# Patient Record
Sex: Female | Born: 1955 | Race: Black or African American | Hispanic: No | Marital: Single | Smoking: Former smoker
Health system: Southern US, Community
[De-identification: ages and names within clinical notes are randomized; demographics above are authoritative.]

## PROBLEM LIST (undated history)

## (undated) DIAGNOSIS — I10 Essential (primary) hypertension: Secondary | ICD-10-CM

## (undated) DIAGNOSIS — E119 Type 2 diabetes mellitus without complications: Secondary | ICD-10-CM

## (undated) HISTORY — PX: ABDOMINAL HYSTERECTOMY: SHX81

## (undated) HISTORY — PX: FOOT SURGERY: SHX648

---

## 1998-03-24 ENCOUNTER — Encounter: Admission: RE | Admit: 1998-03-24 | Discharge: 1998-03-24 | Payer: Self-pay | Admitting: Internal Medicine

## 1998-07-21 ENCOUNTER — Encounter: Admission: RE | Admit: 1998-07-21 | Discharge: 1998-07-21 | Payer: Self-pay | Admitting: Internal Medicine

## 1998-09-05 ENCOUNTER — Encounter: Admission: RE | Admit: 1998-09-05 | Discharge: 1998-09-05 | Payer: Self-pay | Admitting: Internal Medicine

## 1998-12-13 ENCOUNTER — Emergency Department (HOSPITAL_COMMUNITY): Admission: EM | Admit: 1998-12-13 | Discharge: 1998-12-13 | Payer: Self-pay | Admitting: Emergency Medicine

## 1998-12-14 ENCOUNTER — Encounter: Admission: RE | Admit: 1998-12-14 | Discharge: 1998-12-14 | Payer: Self-pay | Admitting: Hematology and Oncology

## 1999-01-15 ENCOUNTER — Encounter: Admission: RE | Admit: 1999-01-15 | Discharge: 1999-01-15 | Payer: Self-pay | Admitting: Internal Medicine

## 2000-03-28 ENCOUNTER — Ambulatory Visit (HOSPITAL_COMMUNITY): Admission: RE | Admit: 2000-03-28 | Discharge: 2000-03-28 | Payer: Self-pay | Admitting: Hematology and Oncology

## 2000-03-28 ENCOUNTER — Encounter: Payer: Self-pay | Admitting: Hematology and Oncology

## 2000-03-28 ENCOUNTER — Encounter: Admission: RE | Admit: 2000-03-28 | Discharge: 2000-03-28 | Payer: Self-pay | Admitting: Internal Medicine

## 2004-12-13 ENCOUNTER — Ambulatory Visit (HOSPITAL_COMMUNITY): Admission: RE | Admit: 2004-12-13 | Discharge: 2004-12-13 | Payer: Self-pay | Admitting: *Deleted

## 2008-08-05 ENCOUNTER — Ambulatory Visit (HOSPITAL_COMMUNITY): Admission: RE | Admit: 2008-08-05 | Discharge: 2008-08-05 | Payer: Self-pay | Admitting: Cardiology

## 2008-09-01 DIAGNOSIS — E119 Type 2 diabetes mellitus without complications: Secondary | ICD-10-CM | POA: Insufficient documentation

## 2008-09-02 ENCOUNTER — Ambulatory Visit: Payer: Self-pay | Admitting: Internal Medicine

## 2008-09-02 DIAGNOSIS — R0989 Other specified symptoms and signs involving the circulatory and respiratory systems: Secondary | ICD-10-CM | POA: Insufficient documentation

## 2008-09-02 DIAGNOSIS — I10 Essential (primary) hypertension: Secondary | ICD-10-CM | POA: Insufficient documentation

## 2008-09-02 DIAGNOSIS — R0609 Other forms of dyspnea: Secondary | ICD-10-CM | POA: Insufficient documentation

## 2008-09-06 ENCOUNTER — Encounter (INDEPENDENT_AMBULATORY_CARE_PROVIDER_SITE_OTHER): Payer: Self-pay | Admitting: *Deleted

## 2008-09-15 ENCOUNTER — Telehealth (INDEPENDENT_AMBULATORY_CARE_PROVIDER_SITE_OTHER): Payer: Self-pay | Admitting: *Deleted

## 2009-11-27 ENCOUNTER — Emergency Department (HOSPITAL_COMMUNITY): Admission: EM | Admit: 2009-11-27 | Discharge: 2009-11-27 | Payer: Self-pay | Admitting: Emergency Medicine

## 2010-12-06 NOTE — Letter (Signed)
Summary: [facility] Results Follow Up Letter  [facility]  [address]   [place], [place] [address]   Phone: [phone]  Fax: [phone]    09/06/2008 MRN: [id number]  FLORELLA MCNEESE [address] [place], [place]  [address]  Dear Ms. Estrin,  We have received the results from your recent tests and have been unable to contact you.  Please call our office at [phone] so that Dr. Sherene Sires or his nurse may review the results with you.    Thank you,  Nature conservation officer Pulmonary Division

## 2011-01-20 LAB — POCT I-STAT, CHEM 8
BUN: 16 mg/dL (ref 6–23)
Calcium, Ion: 1.19 mmol/L (ref 1.12–1.32)
Chloride: 106 mEq/L (ref 96–112)
Creatinine, Ser: 1.2 mg/dL (ref 0.4–1.2)
Glucose, Bld: 145 mg/dL — ABNORMAL HIGH (ref 70–99)
HCT: 41 % (ref 36.0–46.0)
Hemoglobin: 13.9 g/dL (ref 12.0–15.0)
Potassium: 4 mEq/L (ref 3.5–5.1)
Sodium: 140 mEq/L (ref 135–145)
TCO2: 28 mmol/L (ref 0–100)

## 2011-08-06 LAB — HEMOGLOBIN A1C
Hgb A1c MFr Bld: 9.4 — ABNORMAL HIGH
Mean Plasma Glucose: 223

## 2011-08-06 LAB — BASIC METABOLIC PANEL
BUN: 12
CO2: 28
Calcium: 9.6
Chloride: 100
Creatinine, Ser: 1
GFR calc Af Amer: >60
GFR calc non Af Amer: 58 — ABNORMAL LOW
Glucose, Bld: 205 — ABNORMAL HIGH
Potassium: 4
Sodium: 137

## 2011-08-06 LAB — HEPATIC FUNCTION PANEL
ALT: 39 — ABNORMAL HIGH
AST: 39 — ABNORMAL HIGH
Albumin: 4.2
Alkaline Phosphatase: 78
Bilirubin, Direct: 0.2
Indirect Bilirubin: 1.6 — ABNORMAL HIGH
Total Bilirubin: 1.8 — ABNORMAL HIGH
Total Protein: 7.5

## 2011-08-06 LAB — MICROALBUMIN, URINE: Microalb, Ur: 33 — ABNORMAL HIGH

## 2011-08-06 LAB — LIPID PANEL
Cholesterol: 208 — ABNORMAL HIGH
HDL: 26 — ABNORMAL LOW
LDL Cholesterol: 141 — ABNORMAL HIGH
Total CHOL/HDL Ratio: 8
Triglycerides: 207 — ABNORMAL HIGH
VLDL: 41 — ABNORMAL HIGH

## 2012-11-13 ENCOUNTER — Other Ambulatory Visit: Payer: Self-pay | Admitting: Cardiology

## 2012-11-13 DIAGNOSIS — Z1231 Encounter for screening mammogram for malignant neoplasm of breast: Secondary | ICD-10-CM

## 2012-12-08 ENCOUNTER — Ambulatory Visit
Admission: RE | Admit: 2012-12-08 | Discharge: 2012-12-08 | Disposition: A | Discharge status: Home / Self Care | Payer: Self-pay | Source: Ambulatory Visit | Attending: Cardiology | Admitting: Cardiology

## 2012-12-08 DIAGNOSIS — Z1231 Encounter for screening mammogram for malignant neoplasm of breast: Secondary | ICD-10-CM

## 2012-12-09 ENCOUNTER — Other Ambulatory Visit: Payer: Self-pay | Admitting: Cardiology

## 2012-12-09 DIAGNOSIS — R928 Other abnormal and inconclusive findings on diagnostic imaging of breast: Secondary | ICD-10-CM

## 2012-12-23 ENCOUNTER — Ambulatory Visit
Admission: RE | Admit: 2012-12-23 | Discharge: 2012-12-23 | Disposition: A | Discharge status: Home / Self Care | Payer: Medicaid Other | Source: Ambulatory Visit | Attending: Cardiology | Admitting: Cardiology

## 2012-12-23 DIAGNOSIS — R928 Other abnormal and inconclusive findings on diagnostic imaging of breast: Secondary | ICD-10-CM

## 2013-04-28 ENCOUNTER — Encounter: Payer: Self-pay | Admitting: Obstetrics

## 2013-05-31 ENCOUNTER — Ambulatory Visit: Payer: Self-pay | Admitting: Obstetrics & Gynecology

## 2013-09-08 ENCOUNTER — Ambulatory Visit: Payer: Self-pay | Admitting: Obstetrics & Gynecology

## 2013-11-05 DIAGNOSIS — F17200 Nicotine dependence, unspecified, uncomplicated: Secondary | ICD-10-CM | POA: Insufficient documentation

## 2013-11-05 DIAGNOSIS — I10 Essential (primary) hypertension: Secondary | ICD-10-CM | POA: Diagnosis not present

## 2013-11-05 DIAGNOSIS — Z79899 Other long term (current) drug therapy: Secondary | ICD-10-CM | POA: Insufficient documentation

## 2013-11-05 DIAGNOSIS — E860 Dehydration: Secondary | ICD-10-CM | POA: Diagnosis not present

## 2013-11-05 DIAGNOSIS — E663 Overweight: Secondary | ICD-10-CM | POA: Diagnosis not present

## 2013-11-05 DIAGNOSIS — E119 Type 2 diabetes mellitus without complications: Secondary | ICD-10-CM | POA: Diagnosis present

## 2013-11-06 ENCOUNTER — Emergency Department (HOSPITAL_COMMUNITY)
Admission: EM | Admit: 2013-11-06 | Discharge: 2013-11-06 | Disposition: A | Discharge status: Home / Self Care | Payer: PRIVATE HEALTH INSURANCE | Attending: Emergency Medicine | Admitting: Emergency Medicine

## 2013-11-06 ENCOUNTER — Encounter (HOSPITAL_COMMUNITY): Payer: Self-pay | Admitting: Emergency Medicine

## 2013-11-06 DIAGNOSIS — R739 Hyperglycemia, unspecified: Secondary | ICD-10-CM

## 2013-11-06 DIAGNOSIS — E119 Type 2 diabetes mellitus without complications: Secondary | ICD-10-CM | POA: Diagnosis not present

## 2013-11-06 HISTORY — DX: Essential (primary) hypertension: I10

## 2013-11-06 HISTORY — DX: Type 2 diabetes mellitus without complications: E11.9

## 2013-11-06 LAB — COMPREHENSIVE METABOLIC PANEL
ALT: 42 U/L — ABNORMAL HIGH (ref 0–35)
AST: 39 U/L — ABNORMAL HIGH (ref 0–37)
Albumin: 3.9 g/dL (ref 3.5–5.2)
Alkaline Phosphatase: 111 U/L (ref 39–117)
BUN: 23 mg/dL (ref 6–23)
CO2: 25 mEq/L (ref 19–32)
Calcium: 9.4 mg/dL (ref 8.4–10.5)
Chloride: 94 mEq/L — ABNORMAL LOW (ref 96–112)
Creatinine, Ser: 1.35 mg/dL — ABNORMAL HIGH (ref 0.50–1.10)
GFR calc Af Amer: 49 mL/min — ABNORMAL LOW (ref >90)
GFR calc non Af Amer: 43 mL/min — ABNORMAL LOW (ref >90)
Glucose, Bld: 553 mg/dL (ref 70–99)
Potassium: 4.7 mEq/L (ref 3.7–5.3)
Sodium: 133 mEq/L — ABNORMAL LOW (ref 137–147)
Total Bilirubin: 0.8 mg/dL (ref 0.3–1.2)
Total Protein: 7.7 g/dL (ref 6.0–8.3)

## 2013-11-06 LAB — URINE MICROSCOPIC-ADD ON

## 2013-11-06 LAB — URINALYSIS, ROUTINE W REFLEX MICROSCOPIC
Bilirubin Urine: NEGATIVE
Glucose, UA: >1000 mg/dL — AB
Hgb urine dipstick: NEGATIVE
Ketones, ur: NEGATIVE mg/dL
Leukocytes, UA: NEGATIVE
Nitrite: NEGATIVE
Protein, ur: NEGATIVE mg/dL
Specific Gravity, Urine: <1.005 — ABNORMAL LOW (ref 1.005–1.030)
Urobilinogen, UA: 0.2 mg/dL (ref 0.0–1.0)
pH: 5.5 (ref 5.0–8.0)

## 2013-11-06 LAB — CBC WITH DIFFERENTIAL/PLATELET
Basophils Absolute: 0.1 10*3/uL (ref 0.0–0.1)
Basophils Relative: 1 % (ref 0–1)
Eosinophils Absolute: 0.1 10*3/uL (ref 0.0–0.7)
Eosinophils Relative: 2 % (ref 0–5)
HCT: 38.3 % (ref 36.0–46.0)
Hemoglobin: 13 g/dL (ref 12.0–15.0)
Lymphocytes Relative: 50 % — ABNORMAL HIGH (ref 12–46)
Lymphs Abs: 4 10*3/uL (ref 0.7–4.0)
MCH: 32.4 pg (ref 26.0–34.0)
MCHC: 33.9 g/dL (ref 30.0–36.0)
MCV: 95.5 fL (ref 78.0–100.0)
Monocytes Absolute: 0.4 10*3/uL (ref 0.1–1.0)
Monocytes Relative: 5 % (ref 3–12)
Neutro Abs: 3.5 10*3/uL (ref 1.7–7.7)
Neutrophils Relative %: 43 % (ref 43–77)
Platelets: 159 10*3/uL (ref 150–400)
RBC: 4.01 MIL/uL (ref 3.87–5.11)
RDW: 13.2 % (ref 11.5–15.5)
WBC: 8 10*3/uL (ref 4.0–10.5)

## 2013-11-06 LAB — GLUCOSE, CAPILLARY
Glucose-Capillary: 253 mg/dL — ABNORMAL HIGH (ref 70–99)
Glucose-Capillary: 340 mg/dL — ABNORMAL HIGH (ref 70–99)
Glucose-Capillary: 515 mg/dL — ABNORMAL HIGH (ref 70–99)

## 2013-11-06 MED ORDER — INSULIN ASPART 100 UNIT/ML ~~LOC~~ SOLN
10.0000 [IU] | Freq: Once | SUBCUTANEOUS | Status: AC
  Administered 2013-11-06: 10 [IU] via SUBCUTANEOUS
  Filled 2013-11-06: qty 1

## 2013-11-06 MED ORDER — ONDANSETRON HCL 4 MG/2ML IJ SOLN
4.0000 mg | Freq: Once | INTRAMUSCULAR | Status: AC
  Administered 2013-11-06: 4 mg via INTRAVENOUS
  Filled 2013-11-06: qty 2

## 2013-11-06 MED ORDER — SODIUM CHLORIDE 0.9 % IV BOLUS (SEPSIS): 1000.0000 mL | Freq: Once | INTRAVENOUS | Status: DC

## 2013-11-06 MED ORDER — SODIUM CHLORIDE 0.9 % IV BOLUS (SEPSIS)
2000.0000 mL | Freq: Once | INTRAVENOUS | Status: AC
  Administered 2013-11-06: 2000 mL via INTRAVENOUS

## 2013-11-06 NOTE — ED Notes (Signed)
The pt diabetic.  No meds for 3 weeks until 3 days ago .  Now her sugar is high and she has been voiding more and feeling weak with nausea

## 2013-11-06 NOTE — ED Notes (Signed)
Pt. States that she has been out of her DM meds X 3weeks so she knew her BS was going up. She did not get it filled and restarted until a day or 2 ago.

## 2013-11-06 NOTE — Discharge Instructions (Signed)
You need to take your medicines as prescribed.   Follow up with your doctor.   Stay hydrated.   Return to ER if you have vomiting, worse weakness, severe pain.

## 2013-11-06 NOTE — ED Provider Notes (Signed)
CSN: [id number]     Arrival date & time 11/05/13  2355 History   First MD Initiated Contact with Patient 11/06/13 [phone]     Chief Complaint  Patient presents with  . Hyperglycemia   (Consider location/radiation/quality/duration/timing/severity/associated sxs/prior Treatment) The history is provided by the patient.  Amanda Hatfield is a 58 y.o. female hx of HTN, DM, here with hyperglycemia. She was out of her meds for the last 3 weeks and refilled it yesterday. She has been feeling weak and nauseous and urinating more frequently. Denies any fevers or vomiting or abdominal pain. She has her medicine and has been taking since yesterday. She checked her sugar at home and it was 500.    Past Medical History  Diagnosis Date  . Hypertension   . Diabetes mellitus without complication    History reviewed. No pertinent past surgical history. No family history on file. History  Substance Use Topics  . Smoking status: Current Every Day Smoker  . Smokeless tobacco: Not on file  . Alcohol Use: No   OB History   Grav Para Term Preterm Abortions TAB SAB Ect Mult Living                 Review of Systems  Gastrointestinal: Positive for nausea.  Neurological: Positive for weakness.  All other systems reviewed and are negative.    Allergies  Review of patient's allergies indicates no known allergies.  Home Medications   Current Outpatient Rx  Name  Route  Sig  Dispense  Refill  . glimepiride (AMARYL) 4 MG tablet   Oral   Take 1 tablet by mouth daily.         Marland Kitchen losartan (COZAAR) 100 MG tablet   Oral   Take 1 tablet by mouth daily.         . metFORMIN (GLUCOPHAGE) 500 MG tablet   Oral   Take 1 tablet by mouth 2 (two) times daily.         . metoprolol succinate (TOPROL-XL) 50 MG 24 hr tablet   Oral   Take 1 tablet by mouth daily.         . pioglitazone (ACTOS) 15 MG tablet   Oral   Take 1 tablet by mouth daily.         . polyethylene glycol powder (GLYCOLAX/MIRALAX)  powder   Oral   Take 17 g by mouth daily.         . simvastatin (ZOCOR) 40 MG tablet   Oral   Take 1 tablet by mouth daily.         Marland Kitchen tiZANidine (ZANAFLEX) 4 MG tablet   Oral   Take 1 tablet by mouth 2 (two) times daily.          BP 101/62  Pulse 67  Temp(Src) 98 F (36.7 C) (Oral)  Resp 18  Wt 256 lb 6 oz (116.291 kg)  SpO2 96% Physical Exam  Nursing note and vitals reviewed. Constitutional: She is oriented to person, place, and time.  Overweight, tired, slightly dehydrated   HENT:  Head: Normocephalic.  MM slightly dry   Eyes: Conjunctivae are normal. Pupils are equal, round, and reactive to light.  Neck: Normal range of motion. Neck supple.  Cardiovascular: Normal rate, regular rhythm and normal heart sounds.   Pulmonary/Chest: Effort normal and breath sounds normal. No respiratory distress. She has no wheezes. She has no rales.  Abdominal: Soft. Bowel sounds are normal. She exhibits no distension. There is no tenderness. There  is no rebound and no guarding.  Musculoskeletal: Normal range of motion. She exhibits no edema.  Neurological: She is alert and oriented to person, place, and time. No cranial nerve deficit. Coordination normal.  Skin: Skin is warm and dry.  Psychiatric: She has a normal mood and affect. Her behavior is normal. Judgment and thought content normal.    ED Course  Procedures (including critical care time) Labs Review Labs Reviewed  CBC WITH DIFFERENTIAL - Abnormal; Notable for the following:    Lymphocytes Relative 50 (*)    All other components within normal limits  COMPREHENSIVE METABOLIC PANEL - Abnormal; Notable for the following:    Sodium 133 (*)    Chloride 94 (*)    Glucose, Bld 553 (*)    Creatinine, Ser 1.35 (*)    AST 39 (*)    ALT 42 (*)    GFR calc non Af Amer 43 (*)    GFR calc Af Amer 49 (*)    All other components within normal limits  URINALYSIS, ROUTINE W REFLEX MICROSCOPIC - Abnormal; Notable for the following:     Specific Gravity, Urine <1.005 (*)    Glucose, UA >1000 (*)    All other components within normal limits  GLUCOSE, CAPILLARY - Abnormal; Notable for the following:    Glucose-Capillary 515 (*)    All other components within normal limits  GLUCOSE, CAPILLARY - Abnormal; Notable for the following:    Glucose-Capillary 340 (*)    All other components within normal limits  GLUCOSE, CAPILLARY - Abnormal; Notable for the following:    Glucose-Capillary 253 (*)    All other components within normal limits  URINE MICROSCOPIC-ADD ON   Imaging Review No results found.  EKG Interpretation   None       MDM  No diagnosis found. Amanda Hatfield is a 58 y.o. female here with hyperglycemia. Only mild AG of 14. CBG dec from 515 to 253 after IVF and subcutaneous insulin. No signs of DKA and tolerated PO fluids. Stable for d/c.      Richardean Canalavid H Yao, MD 11/06/13 [id number]

## 2014-02-09 ENCOUNTER — Other Ambulatory Visit: Payer: Self-pay | Admitting: Internal Medicine

## 2014-02-09 DIAGNOSIS — N6310 Unspecified lump in the right breast, unspecified quadrant: Secondary | ICD-10-CM

## 2014-02-18 ENCOUNTER — Other Ambulatory Visit: Payer: Self-pay | Admitting: Internal Medicine

## 2014-02-18 ENCOUNTER — Ambulatory Visit
Admission: RE | Admit: 2014-02-18 | Discharge: 2014-02-18 | Disposition: A | Discharge status: Home / Self Care | Payer: PRIVATE HEALTH INSURANCE | Source: Ambulatory Visit | Attending: Internal Medicine | Admitting: Internal Medicine

## 2014-02-18 DIAGNOSIS — N6310 Unspecified lump in the right breast, unspecified quadrant: Secondary | ICD-10-CM

## 2015-01-03 ENCOUNTER — Other Ambulatory Visit (HOSPITAL_COMMUNITY): Payer: Self-pay | Admitting: Internal Medicine

## 2015-01-03 ENCOUNTER — Ambulatory Visit (HOSPITAL_COMMUNITY)
Admission: RE | Admit: 2015-01-03 | Discharge: 2015-01-03 | Disposition: A | Discharge status: Home / Self Care | Payer: Commercial Managed Care - HMO | Source: Ambulatory Visit | Attending: Internal Medicine | Admitting: Internal Medicine

## 2015-01-03 DIAGNOSIS — M47896 Other spondylosis, lumbar region: Secondary | ICD-10-CM | POA: Insufficient documentation

## 2015-01-03 DIAGNOSIS — R52 Pain, unspecified: Secondary | ICD-10-CM

## 2015-01-03 DIAGNOSIS — M25552 Pain in left hip: Secondary | ICD-10-CM | POA: Diagnosis not present

## 2015-04-05 ENCOUNTER — Other Ambulatory Visit: Payer: Self-pay | Admitting: Internal Medicine

## 2015-04-05 ENCOUNTER — Other Ambulatory Visit: Payer: Self-pay

## 2015-04-05 DIAGNOSIS — N6489 Other specified disorders of breast: Secondary | ICD-10-CM

## 2015-04-11 ENCOUNTER — Other Ambulatory Visit: Payer: Commercial Managed Care - HMO

## 2015-04-12 ENCOUNTER — Ambulatory Visit
Admission: RE | Admit: 2015-04-12 | Discharge: 2015-04-12 | Disposition: A | Discharge status: Home / Self Care | Payer: Commercial Managed Care - HMO | Source: Ambulatory Visit | Attending: Internal Medicine | Admitting: Internal Medicine

## 2015-04-12 DIAGNOSIS — N6489 Other specified disorders of breast: Secondary | ICD-10-CM

## 2016-03-01 ENCOUNTER — Other Ambulatory Visit: Payer: Self-pay

## 2016-03-01 DIAGNOSIS — Z1231 Encounter for screening mammogram for malignant neoplasm of breast: Secondary | ICD-10-CM

## 2016-04-15 ENCOUNTER — Ambulatory Visit: Payer: Commercial Managed Care - HMO

## 2016-04-23 ENCOUNTER — Ambulatory Visit
Admission: RE | Admit: 2016-04-23 | Discharge: 2016-04-23 | Disposition: A | Discharge status: Home / Self Care | Payer: Medicare HMO | Source: Ambulatory Visit

## 2016-04-23 ENCOUNTER — Other Ambulatory Visit: Payer: Self-pay | Admitting: Internal Medicine

## 2016-04-23 DIAGNOSIS — Z1231 Encounter for screening mammogram for malignant neoplasm of breast: Secondary | ICD-10-CM

## 2017-03-13 ENCOUNTER — Other Ambulatory Visit: Payer: Self-pay | Admitting: Internal Medicine

## 2017-03-13 DIAGNOSIS — Z1231 Encounter for screening mammogram for malignant neoplasm of breast: Secondary | ICD-10-CM

## 2017-04-24 ENCOUNTER — Ambulatory Visit: Payer: Medicare HMO

## 2017-04-30 ENCOUNTER — Other Ambulatory Visit: Payer: Self-pay | Admitting: Internal Medicine

## 2017-04-30 DIAGNOSIS — E2839 Other primary ovarian failure: Secondary | ICD-10-CM

## 2017-05-06 ENCOUNTER — Ambulatory Visit
Admission: RE | Admit: 2017-05-06 | Discharge: 2017-05-06 | Disposition: A | Discharge status: Home / Self Care | Payer: Medicare Other | Source: Ambulatory Visit | Attending: Internal Medicine | Admitting: Internal Medicine

## 2017-05-06 ENCOUNTER — Ambulatory Visit: Payer: Medicare HMO

## 2017-05-06 DIAGNOSIS — Z1231 Encounter for screening mammogram for malignant neoplasm of breast: Secondary | ICD-10-CM

## 2017-05-06 DIAGNOSIS — E2839 Other primary ovarian failure: Secondary | ICD-10-CM

## 2017-05-09 ENCOUNTER — Ambulatory Visit (INDEPENDENT_AMBULATORY_CARE_PROVIDER_SITE_OTHER): Payer: Medicaid Other | Admitting: Specialist

## 2017-05-22 ENCOUNTER — Ambulatory Visit (INDEPENDENT_AMBULATORY_CARE_PROVIDER_SITE_OTHER): Payer: Medicaid Other | Admitting: Specialist

## 2017-07-24 ENCOUNTER — Encounter (INDEPENDENT_AMBULATORY_CARE_PROVIDER_SITE_OTHER): Payer: Self-pay | Admitting: Specialist

## 2017-07-24 ENCOUNTER — Ambulatory Visit (INDEPENDENT_AMBULATORY_CARE_PROVIDER_SITE_OTHER): Payer: Medicare Other

## 2017-07-24 ENCOUNTER — Ambulatory Visit (INDEPENDENT_AMBULATORY_CARE_PROVIDER_SITE_OTHER): Payer: Medicare Other | Admitting: Specialist

## 2017-07-24 VITALS — BP 116/86 | HR 77 | Ht 62.0 in | Wt 216.0 lb

## 2017-07-24 DIAGNOSIS — M25512 Pain in left shoulder: Secondary | ICD-10-CM

## 2017-07-24 DIAGNOSIS — M545 Low back pain: Secondary | ICD-10-CM | POA: Diagnosis not present

## 2017-07-24 DIAGNOSIS — M7582 Other shoulder lesions, left shoulder: Secondary | ICD-10-CM

## 2017-07-24 DIAGNOSIS — M48062 Spinal stenosis, lumbar region with neurogenic claudication: Secondary | ICD-10-CM

## 2017-07-24 DIAGNOSIS — M7542 Impingement syndrome of left shoulder: Secondary | ICD-10-CM

## 2017-07-24 DIAGNOSIS — M778 Other enthesopathies, not elsewhere classified: Secondary | ICD-10-CM

## 2017-07-24 NOTE — Progress Notes (Signed)
Office Visit Note   Patient: Amanda Hatfield.           Date of Birth: 07-Dec-1955           MRN: [id number] Visit Date: 07/24/2017              Requested by: Fleet Contras, MD [address] [place], [place] [address] PCP: Fleet Contras, MD   Assessment & Plan: Visit Diagnoses:  1. Low back pain, unspecified back pain laterality, unspecified chronicity, with sciatica presence unspecified   2. Left shoulder pain, unspecified chronicity     Plan:Avoid overhead lifting and overhead use of the arms. Do not lift greater than 10 lbs. Tylenol ES one every 6-8 hours for pain and inflamation. Continue with PT for another 2-3 weeks, then a home exercise program. Avoid bending, stooping and avoid lifting weights greater than 10 lbs. Avoid prolong standing and walking. Avoid frequent bending and stooping  No lifting greater than 10 lbs. May use ice or moist heat for pain. Weight loss is of benefit. Handicap license is approved. Dr. [place] Blas secretary/Assistant will call to arrange for epidural steroid injection   Follow-Up Instructions: No Follow-up on file.   Orders:  Orders Placed This Encounter  Procedures  . XR Lumbar Spine 2-3 Views  . XR Shoulder Left   No orders of the defined types were placed in this encounter.     Procedures: Large Joint Inj Date/Time: 07/24/2017 1:14 PM Performed by: Kerrin Champagne Authorized by: Kerrin Champagne   Consent Given by:  Patient Site marked: the procedure site was marked   Indications:  Pain Location:  Shoulder Site:  L subacromial bursa Prep: patient was prepped and draped in usual sterile fashion   Needle Size:  25 G Needle Length:  1.5 inches Approach:  Anterolateral Ultrasound Guidance: No   Fluoroscopic Guidance: No   Arthrogram: No   Medications:  40 mg methylPREDNISolone acetate 40 MG/ML; 4 mL bupivacaine 0.25 % Aspiration Attempted: No   Patient tolerance:  Patient tolerated the procedure well with no immediate  complications     Clinical Data: No additional findings.   Subjective: Chief Complaint  Patient presents with  . Lower Back - Pain  . Left Shoulder - Pain    61 year old female seen in the past with lumbar spinal stenosis she has had previous ESIs with excellent relief the last in 2013 left L4-5 ESI with excellent relief. Pain is present standing sitting and lying, leaning. Leans on cart when grocery shopping. Sitting is not better than standing. Right now the pain is present sitting. Lying with pain and with bending increase. Pain is into the left thigh deep and radiates down into the left calf anterior and the anterior medial and lateral calf. Definitely standing and walking is worse. Can only about one block then has to sit and wait a few minutes and sit. Has to sit for the pain to improve. No bowel or bladder difficulty.Left shoulder pain with lying on the left shoulder and over head lifting and overhead use of the left arm has had increase in use of the arms with recent change of domicile.     Review of Systems  Constitutional: Positive for activity change. Negative for appetite change, diaphoresis, fatigue, fever and unexpected weight change.  HENT: Negative.  Negative for congestion, dental problem, drooling, ear discharge, ear pain, facial swelling, hearing loss, mouth sores, nosebleeds, postnasal drip, rhinorrhea, sinus pain, sinus pressure, sneezing, sore throat, tinnitus, trouble  swallowing and voice change.   Eyes: Negative.   Respiratory: Negative for chest tightness, shortness of breath and wheezing.   Cardiovascular: Negative.  Negative for chest pain, palpitations and leg swelling.  Gastrointestinal: Positive for blood in stool. Negative for abdominal distention, constipation, diarrhea, nausea, rectal pain and vomiting.  Endocrine: Positive for cold intolerance and heat intolerance.  Genitourinary: Negative.  Negative for difficulty urinating, dyspareunia, dysuria,  enuresis, flank pain, frequency and hematuria.  Musculoskeletal: Positive for arthralgias, back pain and gait problem.  Skin: Negative.  Negative for color change, pallor, rash and wound.  Allergic/Immunologic: Negative.  Negative for environmental allergies, food allergies and immunocompromised state.  Neurological: Negative for dizziness, tremors, seizures, syncope, facial asymmetry, speech difficulty, weakness, light-headedness, numbness and headaches.  Hematological: Negative.  Negative for adenopathy. Does not bruise/bleed easily.  Psychiatric/Behavioral: Negative.  Negative for behavioral problems, confusion, decreased concentration, dysphoric mood, hallucinations, self-injury, sleep disturbance and suicidal ideas. The patient is not nervous/anxious and is not hyperactive.      Objective: Vital Signs: BP 116/86 (BP Location: Left Arm, Patient Position: Sitting)   Pulse 77   Ht  (1.575 m)   Wt 216 lb (98 kg)   BMI 39.51 kg/m   Physical Exam  Constitutional: She is oriented to person, place, and time. She appears well-developed and well-nourished.  HENT:  Head: Normocephalic and atraumatic.  Eyes: Pupils are equal, round, and reactive to light. EOM are normal.  Neck: Normal range of motion. Neck supple.  Pulmonary/Chest: Effort normal and breath sounds normal.  Abdominal: Soft. Bowel sounds are normal.  Neurological: She is alert and oriented to person, place, and time.  Skin: Skin is warm and dry. She is not diaphoretic.  Psychiatric: She has a normal mood and affect. Her behavior is normal. Judgment and thought content normal.    Back Exam   Tenderness  The patient is experiencing tenderness in the lumbar.  Range of Motion  Extension:  10 abnormal  Flexion:  70 normal  Lateral Bend Right: abnormal  Lateral Bend Left: abnormal   Muscle Strength  Right Quadriceps:  5/5  Left Quadriceps:  5/5  Right Hamstrings:  5/5  Left Hamstrings:  5/5   Tests  Straight leg  raise right: negative Straight leg raise left: negative  Reflexes  Patellar: Hyporeflexic Achilles: Hyporeflexic Babinski's sign: normal   Other  Toe Walk: abnormal Heel Walk: abnormal Sensation: normal Gait: normal  Erythema: no back redness      Specialty Comments:  No specialty comments available.  Imaging: No results found.   PMFS History: Patient Active Problem List   Diagnosis Date Noted  . HYPERTENSION 09/02/2008  . DYSPNEA 09/02/2008  . DIABETES, TYPE 2 09/01/2008   Past Medical History:  Diagnosis Date  . Diabetes mellitus without complication (HCC)   . Hypertension     No family history on file.  No past surgical history on file. Social History   Occupational History  . housekeeping   . Surveyor, mining    Social History Main Topics  . Smoking status: Former Smoker    Types: Cigarettes, E-cigarettes  . Smokeless tobacco: Current User  . Alcohol use No  . Drug use: Unknown  . Sexual activity: Not on file

## 2017-07-24 NOTE — Patient Instructions (Signed)
Avoid overhead lifting and overhead use of the arms. Do not lift greater than 10 lbs. Tylenol ES one every 6-8 hours for pain and inflamation. Continue with PT for another 2-3 weeks, then a home exercise program. Avoid bending, stooping and avoid lifting weights greater than 10 lbs. Avoid prolong standing and walking. Avoid frequent bending and stooping  No lifting greater than 10 lbs. May use ice or moist heat for pain. Weight loss is of benefit. Handicap license is approved. Dr. [place] Blas secretary/Assistant will call to arrange for epidural steroid injection

## 2017-08-14 ENCOUNTER — Ambulatory Visit (INDEPENDENT_AMBULATORY_CARE_PROVIDER_SITE_OTHER): Payer: Medicare Other

## 2017-08-14 ENCOUNTER — Encounter (INDEPENDENT_AMBULATORY_CARE_PROVIDER_SITE_OTHER): Payer: Self-pay | Admitting: Physical Medicine and Rehabilitation

## 2017-08-14 ENCOUNTER — Ambulatory Visit (INDEPENDENT_AMBULATORY_CARE_PROVIDER_SITE_OTHER): Payer: Medicare Other | Admitting: Physical Medicine and Rehabilitation

## 2017-08-14 VITALS — BP 141/90 | HR 76 | Temp 99.1°F

## 2017-08-14 DIAGNOSIS — M5416 Radiculopathy, lumbar region: Secondary | ICD-10-CM

## 2017-08-14 MED ORDER — LIDOCAINE HCL (PF) 1 % IJ SOLN
2.0000 mL | Freq: Once | INTRAMUSCULAR | Status: AC
  Administered 2017-08-14: 2 mL

## 2017-08-14 MED ORDER — BETAMETHASONE SOD PHOS & ACET 6 (3-3) MG/ML IJ SUSP
12.0000 mg | Freq: Once | INTRAMUSCULAR | Status: AC
  Administered 2017-08-14: 12 mg

## 2017-08-14 NOTE — Patient Instructions (Signed)

## 2017-08-14 NOTE — Progress Notes (Deleted)
Pt here for lumber injection of lower back, pt has driver, pt taking baby aspirin, no allergy to dye, pain mostly right side radiating to the left, no numbness or tingling but is throbbing some.back has been constantly hurting rather she is sitting, standing etc, pt taking tramadol for pain.

## 2017-08-21 ENCOUNTER — Ambulatory Visit (INDEPENDENT_AMBULATORY_CARE_PROVIDER_SITE_OTHER): Payer: Medicare Other | Admitting: Specialist

## 2017-08-21 NOTE — Procedures (Signed)
Amanda Hatfield is a 61 year old female with chronic long history of back pain and radicular pain. She's had prior injections of lumbar spine 2013. She is followed by Dr. Theda Sersnegative saw her recently. He has requested diagnostic L3 and L4 transforaminal injections on the left. She is having left radicular pain. She also has some right-sided low back pain.The injection  will be diagnostic and hopefully therapeutic. The patient has failed conservative care including time, medications and activity modification.  Lumbosacral Transforaminal Epidural Steroid Injection - Sub-Pedicular Approach with Fluoroscopic Guidance  Patient: Amanda Hatfield V.      Date of Birth: 09/21/1956 MRN: [id number] PCP: Fleet ContrasAvbuere, Edwin, MD      Visit Date: 08/14/2017   Universal Protocol:    Date/Time: 08/14/2017  Consent Given By: the patient  Position: PRONE  Additional Comments: Vital signs were monitored before and after the procedure. Patient was prepped and draped in the usual sterile fashion. The correct patient, procedure, and site was verified.   Injection Procedure Details:  Procedure Site One Meds Administered:  Meds ordered this encounter  Medications  . lidocaine (PF) (XYLOCAINE) 1 % injection 2 mL  . betamethasone acetate-betamethasone sodium phosphate (CELESTONE) injection 12 mg    Laterality: Left  Location/Site:  L3-L4 L4-L5  Needle size: 22 G  Needle type: Spinal  Needle Placement: Transforaminal  Findings:  -Contrast Used: 0.5 mL iohexol 180 mg iodine/mL   -Comments: Excellent flow of contrast along the nerve and into the epidural space.  Procedure Details: After squaring off the end-plates to get a true AP view, the C-arm was positioned so that an oblique view of the foramen as noted above was visualized. The target area is just inferior to the "nose of the scotty dog" or sub pedicular. The soft tissues overlying this structure were infiltrated with 2-3 ml. of 1% Lidocaine without  Epinephrine.  The spinal needle was inserted toward the target using a "trajectory" view along the fluoroscope beam.  Under AP and lateral visualization, the needle was advanced so it did not puncture dura and was located close the 6 O'Clock position of the pedical in AP tracterory. Biplanar projections were used to confirm position. Aspiration was confirmed to be negative for CSF and/or blood. A 1-2 ml. volume of Isovue-250 was injected and flow of contrast was noted at each level. Radiographs were obtained for documentation purposes.   After attaining the desired flow of contrast documented above, a 0.5 to 1.0 ml test dose of 0.25% Marcaine was injected into each respective transforaminal space.  The patient was observed for 90 seconds post injection.  After no sensory deficits were reported, and normal lower extremity motor function was noted,   the above injectate was administered so that equal amounts of the injectate were placed at each foramen (level) into the transforaminal epidural space.   Additional Comments:  The patient tolerated the procedure well Dressing: Band-Aid    Post-procedure details: Patient was observed during the procedure. Post-procedure instructions were reviewed.  Patient left the clinic in stable condition.

## 2017-09-08 MED ORDER — METHYLPREDNISOLONE ACETATE 40 MG/ML IJ SUSP
40.0000 mg | INTRAMUSCULAR | Status: AC | PRN
  Administered 2017-07-24: 40 mg via INTRA_ARTICULAR

## 2017-09-08 MED ORDER — BUPIVACAINE HCL 0.25 % IJ SOLN
4.0000 mL | INTRAMUSCULAR | Status: AC | PRN
  Administered 2017-07-24: 4 mL via INTRA_ARTICULAR

## 2018-04-27 ENCOUNTER — Other Ambulatory Visit: Payer: Self-pay | Admitting: Internal Medicine

## 2018-04-27 DIAGNOSIS — Z1231 Encounter for screening mammogram for malignant neoplasm of breast: Secondary | ICD-10-CM

## 2018-05-20 ENCOUNTER — Ambulatory Visit
Admission: RE | Admit: 2018-05-20 | Discharge: 2018-05-20 | Disposition: A | Discharge status: Home / Self Care | Payer: Medicare Other | Source: Ambulatory Visit | Attending: Internal Medicine | Admitting: Internal Medicine

## 2018-05-20 DIAGNOSIS — Z1231 Encounter for screening mammogram for malignant neoplasm of breast: Secondary | ICD-10-CM

## 2018-05-28 ENCOUNTER — Ambulatory Visit (INDEPENDENT_AMBULATORY_CARE_PROVIDER_SITE_OTHER): Payer: Self-pay

## 2018-05-28 ENCOUNTER — Encounter (INDEPENDENT_AMBULATORY_CARE_PROVIDER_SITE_OTHER): Payer: Self-pay | Admitting: Surgery

## 2018-05-28 ENCOUNTER — Ambulatory Visit (INDEPENDENT_AMBULATORY_CARE_PROVIDER_SITE_OTHER): Payer: Medicare Other | Admitting: Surgery

## 2018-05-28 VITALS — BP 116/71 | HR 89

## 2018-05-28 DIAGNOSIS — M25551 Pain in right hip: Secondary | ICD-10-CM | POA: Diagnosis not present

## 2018-05-28 DIAGNOSIS — M5416 Radiculopathy, lumbar region: Secondary | ICD-10-CM | POA: Diagnosis not present

## 2018-05-28 MED ORDER — KETOROLAC TROMETHAMINE 60 MG/2ML IM SOLN: 60.0000 mg | Freq: Once | INTRAMUSCULAR | Status: AC

## 2018-05-28 NOTE — Progress Notes (Signed)
Office Visit Note   Patient: Amanda RudeJuline Shimmin V.           Date of Birth: 08/10/1956           MRN: [id number] Visit Date: 05/28/2018              Requested by: Fleet ContrasAvbuere, Edwin, MD [address] [place], [place] [id number] PCP: Fleet ContrasAvbuere, Edwin, MD   Assessment & Plan: Visit Diagnoses:  1. Pain in right hip   2. Radiculopathy, lumbar region     Plan: Since patient continues have ongoing symptoms and failed conservative treatment with lumbar ESI's I will schedule MRI to rule out HNP/stenosis.  Follow-up with Dr. Otelia SergeantNitka after completion to discuss results and further treatment options.  In hopes of giving her some improvement of her pain she was given a Toradol 60 mg IM injection today.  Did not feel comfortable doing Depo-Medrol or giving a prednisone taper due to history of diabetes.  Follow-Up Instructions: Return in about 3 weeks (around 06/18/2018) for With Dr. Otelia SergeantNitka to review lumbar MRI.   Orders:  Orders Placed This Encounter  Procedures  . XR Lumbar Spine Complete W/Bend  . MR Lumbar Spine w/o contrast   Meds ordered this encounter  Medications  . ketorolac (TORADOL) injection 60 mg      Procedures: No procedures performed   Clinical Data: No additional findings.   Subjective: Chief Complaint  Patient presents with  . Right Hip - Pain    HPI 62 year old white female returns with complaints of chronic back pain and right lower extremity radiculopathy.  Patient has history of multilevel lumbar spondylosis and had lumbar ESI by Dr. Alvester MorinNewton October 2018.  States that this may improve some of her symptoms for few days.  She complains of pain radiating to the right buttock and down to her right foot with numbness and tingling.  No radicular pain in the left leg.  States that her symptoms are progressively worsening. Review of Systems No current cardiac pulmonary GI GU issues  Objective: Vital Signs: BP 116/71 (BP Location: Left Arm, Patient Position: Sitting, Cuff  Size: Normal)   Pulse 89   Physical Exam  Constitutional: She is oriented to person, place, and time. No distress.  HENT:  Head: Normocephalic and atraumatic.  Eyes: Pupils are equal, round, and reactive to light. EOM are normal.  Pulmonary/Chest: No respiratory distress.  Musculoskeletal:  Gait is antalgic.  She has bilateral lumbar paraspinal tenderness/spasm.  Positive bilateral sciatic notch tenderness.  Tender over the left hip greater trochanter bursa.  Negative logroll.  Negative straight leg raise.  No focal motor deficits.  Calves are nontender.  Neurological: She is alert and oriented to person, place, and time.  Skin: Skin is warm and dry.  Psychiatric: She has a normal mood and affect.    Ortho Exam  Specialty Comments:  No specialty comments available.  Imaging: No results found.   PMFS History: Patient Active Problem List   Diagnosis Date Noted  . HYPERTENSION 09/02/2008  . DYSPNEA 09/02/2008  . DIABETES, TYPE 2 09/01/2008   Past Medical History:  Diagnosis Date  . Diabetes mellitus without complication (HCC)   . Hypertension     History reviewed. No pertinent family history.  History reviewed. No pertinent surgical history. Social History   Occupational History  . Occupation: housekeeping  . Occupation: Surveyor, miningpoultry worker  Tobacco Use  . Smoking status: Former Smoker    Types: Cigarettes, E-cigarettes  . Smokeless tobacco: Current User  Substance and Sexual Activity  . Alcohol use: No  . Drug use: Not on file  . Sexual activity: Not on file

## 2019-05-06 ENCOUNTER — Other Ambulatory Visit: Payer: Self-pay | Admitting: Internal Medicine

## 2019-05-06 DIAGNOSIS — Z1231 Encounter for screening mammogram for malignant neoplasm of breast: Secondary | ICD-10-CM

## 2019-06-18 ENCOUNTER — Other Ambulatory Visit: Payer: Self-pay

## 2019-06-18 ENCOUNTER — Ambulatory Visit
Admission: RE | Admit: 2019-06-18 | Discharge: 2019-06-18 | Disposition: A | Discharge status: Home / Self Care | Payer: Medicare Other | Source: Ambulatory Visit | Attending: Internal Medicine | Admitting: Internal Medicine

## 2019-06-18 DIAGNOSIS — Z1231 Encounter for screening mammogram for malignant neoplasm of breast: Secondary | ICD-10-CM

## 2020-01-27 ENCOUNTER — Other Ambulatory Visit: Payer: Self-pay

## 2020-01-27 ENCOUNTER — Ambulatory Visit: Payer: Self-pay

## 2020-01-27 ENCOUNTER — Encounter: Payer: Self-pay | Admitting: Surgery

## 2020-01-27 ENCOUNTER — Ambulatory Visit (INDEPENDENT_AMBULATORY_CARE_PROVIDER_SITE_OTHER): Payer: Medicare HMO | Admitting: Surgery

## 2020-01-27 DIAGNOSIS — M5416 Radiculopathy, lumbar region: Secondary | ICD-10-CM | POA: Diagnosis not present

## 2020-01-27 DIAGNOSIS — M545 Low back pain, unspecified: Secondary | ICD-10-CM

## 2020-01-27 DIAGNOSIS — G8929 Other chronic pain: Secondary | ICD-10-CM | POA: Diagnosis not present

## 2020-01-27 NOTE — Progress Notes (Signed)
   Office Visit Note   Patient: Amanda Hatfield.           Date of Birth: 08-01-1956           MRN: [id number] Visit Date: 01/27/2020              Requested by: Fleet Contras, MD [address] [place],  [place] [address] PCP: Fleet Contras, MD   Assessment & Plan: Visit Diagnoses:  1. Chronic low back pain, unspecified back pain laterality, unspecified whether sciatica present   2. Radiculopathy, lumbar region     Plan: With patient's progressively worsening symptoms since my last office visit with her July 2019 I will  reorder her lumbar MRI. Will follow with Dr. Otelia Sergeant after completion to discuss results and further treatment options.  Follow-Up Instructions: Return in about 3 weeks (around 02/17/2020) for WITH DR NITKA TO REVIEW LUMBAR MRI SCAN.   Orders:  Orders Placed This Encounter  Procedures  . XR Lumbar Spine 2-3 Views  . MR Lumbar Spine w/o contrast   No orders of the defined types were placed in this encounter.     Procedures: No procedures performed   Clinical Data: No additional findings.   Subjective: Chief Complaint  Patient presents with  . Lower Back - Pain    HPI 64 year old low back pain and right lower extremity radiculopathy.  Patient has known history of lumbar spondylosis.  She was seen by me May 28, 2018 for similar complaint and I had ordered a lumbar MRI scan but this was not done for some reason.  States that symptoms have been progressively worsening.  Aggravated with standing, laying down and prolonged ambulation.  No left leg symptoms.  She has received medications from her primary care physician without relief of her pain. Review of Systems No current cardiac pulmonary GI GU issues  Objective: Vital Signs: There were no vitals taken for this visit.  Physical Exam Eyes:     Extraocular Movements: Extraocular movements intact.     Pupils: Pupils are equal, round, and reactive to light.  Pulmonary:     Effort: No respiratory  distress.  Musculoskeletal:     Comments: Gait antalgic.  Negative log bilat hips.  Neg straight leg raise.  No focal motor deficits.   Neurological:     General: No focal deficit present.     Mental Status: She is alert and oriented to person, place, and time.     Ortho Exam  Specialty Comments:  No specialty comments available.  Imaging: No results found.   PMFS History: Patient Active Problem List   Diagnosis Date Noted  . HYPERTENSION 09/02/2008  . DYSPNEA 09/02/2008  . DIABETES, TYPE 2 09/01/2008   Past Medical History:  Diagnosis Date  . Diabetes mellitus without complication (HCC)   . Hypertension     History reviewed. No pertinent family history.  History reviewed. No pertinent surgical history. Social History   Occupational History  . Occupation: housekeeping  . Occupation: Surveyor, mining  Tobacco Use  . Smoking status: Former Smoker    Types: Cigarettes, E-cigarettes  . Smokeless tobacco: Current User  Substance and Sexual Activity  . Alcohol use: No  . Drug use: Not on file  . Sexual activity: Not on file

## 2020-02-14 ENCOUNTER — Other Ambulatory Visit: Payer: Self-pay

## 2020-02-14 ENCOUNTER — Ambulatory Visit (HOSPITAL_COMMUNITY)
Admission: RE | Admit: 2020-02-14 | Discharge: 2020-02-14 | Disposition: A | Discharge status: Home / Self Care | Payer: Medicare HMO | Source: Ambulatory Visit | Attending: Surgery | Admitting: Surgery

## 2020-02-14 DIAGNOSIS — M5416 Radiculopathy, lumbar region: Secondary | ICD-10-CM | POA: Diagnosis present

## 2020-02-17 ENCOUNTER — Other Ambulatory Visit: Payer: Self-pay

## 2020-02-17 ENCOUNTER — Ambulatory Visit (INDEPENDENT_AMBULATORY_CARE_PROVIDER_SITE_OTHER): Payer: Medicare HMO | Admitting: Specialist

## 2020-02-17 ENCOUNTER — Encounter: Payer: Self-pay | Admitting: Specialist

## 2020-02-17 VITALS — BP 139/79 | HR 96 | Ht 62.0 in | Wt 215.0 lb

## 2020-02-17 DIAGNOSIS — M48062 Spinal stenosis, lumbar region with neurogenic claudication: Secondary | ICD-10-CM

## 2020-02-17 DIAGNOSIS — M4316 Spondylolisthesis, lumbar region: Secondary | ICD-10-CM

## 2020-02-17 NOTE — Patient Instructions (Signed)
Avoid bending, stooping and avoid lifting weights greater than 10 lbs. Avoid prolong standing and walking. Avoid frequent bending and stooping  No lifting greater than 10 lbs. May use ice or moist heat for pain. Weight loss is of benefit. Handicap license is approved. Dr. Gilbert Blas secretary/Assistant will call to arrange for epidural steroid injection I don't recommend arthritis medications due to previous history of elevated creatinine and renal insufficiency and diabetes.   Hot showers in the AM.  Injection with steroid may be of benefit. Hemp CBD capsules, [email or web address] 5,000-7,000 mg per bottle, 60 capsules per bottle, take one capsule twice a day.  Follow-Up Instructions: No follow-ups on file.

## 2020-02-17 NOTE — Progress Notes (Signed)
Office Visit Note   Patient: Amanda Hatfield.           Date of Birth: Apr 08, 1956           MRN: [id number] Visit Date: 02/17/2020              Requested by: Nolene Ebbs, MD [address] [place],  [place] [address] PCP: Nolene Ebbs, MD   Assessment & Plan: Visit Diagnoses:  1. Spinal stenosis of lumbar region with neurogenic claudication   2. Spondylolisthesis, lumbar region     Plan: Avoid bending, stooping and avoid lifting weights greater than 10 lbs. Avoid prolong standing and walking. Avoid frequent bending and stooping  No lifting greater than 10 lbs. May use ice or moist heat for pain. Weight loss is of benefit. Handicap license is approved. Dr. Romona Curls secretary/Assistant will call to arrange for epidural steroid injection I don't recommend arthritis medications due to previous history of elevated creatinine and renal insufficiency and diabetes.   Hot showers in the AM.  Injection with steroid may be of benefit. Hemp CBD capsules, [email or web address] 5,000-7,000 mg per bottle, 60 capsules per bottle, take one capsule twice a day.  Follow-Up Instructions: No follow-ups on file.   Orders:  No orders of the defined types were placed in this encounter.  No orders of the defined types were placed in this encounter.     Procedures: No procedures performed   Clinical Data: Findings:  CLINICAL DATA: Radiculopathy, lumbar region. Worsening low back pain, right lower extremity radiculopathy, neurogenic claudication; low back pain, greater than 6 weeks; lumbar radiculopathy, greater than 6 weeks, low back pain, progressive neurologic deficit. Additional history provided by technologist: Patient reports low back pain for years, numbness and tingling down both legs into feet.  EXAM: MRI LUMBAR SPINE WITHOUT CONTRAST  TECHNIQUE: Multiplanar, multisequence MR imaging of the lumbar spine was performed. No intravenous contrast was administered.  COMPARISON:  Radiographs of the lumbar spine 01/27/2020, lumbar spine MRI 03/14/2011  FINDINGS: Mild intermittent motion degradation.  Segmentation: For the purposes of this dictation, five lumbar vertebrae are assumed and the caudal most well-formed intervertebral disc is designated L5-S1.  Alignment: Straightening of the expected lumbar lordosis. L2-L3 grade 1 anterolisthesis. Trace L3-L4 and L4-L5 anterolisthesis.  Vertebrae: Vertebral body height is maintained. Edema signal within the bilateral L4 pedicles (greater on the right) and right L5 pedicle, which may be degenerative and related to adjacent facet arthrosis. Stress reaction may also have this imaging appearance. Mild multilevel predominantly fatty degenerative endplate marrow signal.  Conus medullaris and cauda equina: Conus extends to the L1-L2 level. 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  Paraspinal and other soft tissues: Incompletely assessed right renal cysts. Atrophy of the lumbar paraspinal musculature.  Disc levels:  Unless otherwise stated, the level by level findings below have not significantly changed since prior MRI 03/15/2011.  Mild to moderate L2-L3 disc degeneration, progressed. Mild disc degeneration at the remaining levels is similar to the prior exam.  T11-T12: This level is not included on axial imaging. Small disc bulge with redemonstrated small left foraminal disc protrusion. No more than mild spinal canal stenosis. Incompletely assessed left neural foraminal narrowing.  T12-L1: Small disc bulge. A small superimposed central disc protrusion is new from prior exam. Mild relative narrowing of the spinal canal without nerve root impingement. No significant foraminal stenosis.  L1-L2: Mild facet arthrosis. No significant disc herniation, spinal canal  stenosis or neural foraminal narrowing.  L2-L3: Grade 1 anterolisthesis  is new from prior examination. Disc bulge, progressed. Advanced facet arthrosis with ligamentum flavum hypertrophy, progressed. 3 mm ventrally projecting synovial facet cyst on the left new from prior exam (series 8, image 17). Moderately severe spinal canal stenosis with right greater than left subarticular narrowing, progressed. Mild bilateral neural foraminal narrowing, progressed.  L3-L4: Grade 1 anterolisthesis. Disc bulge, progressed. Superimposed broad-based right foraminal/extraforaminal disc protrusion. Advanced facet arthrosis with ligamentum flavum hypertrophy, progressed. Severe spinal canal stenosis with near complete effacement of the thecal sac, progressed. Unchanged moderate right neural foraminal narrowing.  L4-L5: Disc bulge with endplate spurring. Superimposed right subarticular/foraminal disc protrusion. Moderate facet arthrosis with ligamentum flavum hypertrophy. Small amount of asymmetric fluid within the right facet joint. Moderate right with mild left subarticular stenosis, not significantly changed. Progressive moderate central canal stenosis. Unchanged moderate right neural foraminal narrowing.  L5-S1: Disc bulge. Central zone posterior annular fissure. Moderate facet arthrosis/ligamentum flavum hypertrophy. Minimal bilateral subarticular narrowing without nerve root impingement. Central canal patent. Mild bilateral neural foraminal narrowing (greater on the right).  IMPRESSION: Lumbar spondylosis as outlined and progressed at several levels since prior MRI 03/15/2011. Findings are most notably as follows.  At L3-L4, there is progressive multifactorial severe spinal canal stenosis now with near complete effacement of the thecal sac. As before, a disc protrusion contributes to moderate right neural foraminal narrowing.  At L2-L3, progressive multifactorial moderately severe spinal canal stenosis with right greater than left subarticular narrowing.  There is also progressive mild bilateral neural foraminal narrowing at this level.  At L4-L5, progressive multifactorial moderate central canal stenosis. As before, a broad-based right subarticular/foraminal disc protrusion contributes to moderate right subarticular and neural foraminal narrowing.   Electronically Signed  By: Jackey Loge DO  On: 02/14/2020 21:17    Subjective: Chief Complaint  Patient presents with  . Lower Back - Follow-up    MRI Review    64 year old female with neurogenic claudication with ability to grocery shop but it is difficult and she takes awhile to complete. She  Normally sends her granddaughter or daughter to the store. She has pain in the legs bilaterally, difficulty with night pain and constantly changing position in bed at night. No bowel or bladder difficulty, but has a little at night has some difficulty with urgency and has to hold legs together to get herself to the rest room.    Review of Systems  Constitutional: Negative.   HENT: Negative.   Eyes: Negative.   Respiratory: Negative.   Cardiovascular: Negative.   Gastrointestinal: Negative.   Endocrine: Negative.   Genitourinary: Negative.   Musculoskeletal: Positive for back pain and gait problem.  Skin: Negative.   Allergic/Immunologic: Negative.   Hematological: Negative.   Psychiatric/Behavioral: Negative.      Objective: Vital Signs: BP 139/79 (BP Location: Left Arm, Patient Position: Sitting)   Pulse 96   Ht 5\' 2"  (1.575 m)   Wt 215 lb (97.5 kg)   BMI 39.32 kg/m   Physical Exam Constitutional:      Appearance: She is well-developed.  HENT:     Head: Normocephalic and atraumatic.  Eyes:     Pupils: Pupils are equal, round, and reactive to light.  Pulmonary:     Effort: Pulmonary effort is normal.     Breath sounds: Normal breath sounds.  Abdominal:     General: Bowel sounds are normal.     Palpations: Abdomen is soft.  Musculoskeletal:  Cervical back: Normal  range of motion and neck supple.     Lumbar back: Negative right straight leg raise test and negative left straight leg raise test.  Skin:    General: Skin is warm and dry.  Neurological:     Mental Status: She is alert and oriented to person, place, and time.  Psychiatric:        Behavior: Behavior normal.        Thought Content: Thought content normal.        Judgment: Judgment normal.     Back Exam   Tenderness  The patient is experiencing tenderness in the lumbar.  Range of Motion  Extension: abnormal  Flexion: abnormal  Lateral bend right: abnormal  Lateral bend left: abnormal  Rotation right: abnormal  Rotation left: abnormal   Muscle Strength  Right Quadriceps:  5/5  Left Quadriceps:  5/5  Right Hamstrings:  5/5  Left Hamstrings:  5/5   Tests  Straight leg raise right: negative Straight leg raise left: negative  Reflexes  Patellar: 2/4 Achilles: 0/4  Other  Toe walk: abnormal Heel walk: abnormal Sensation: normal Gait: normal   Comments:  Leg strength in sitting is normal. Bilateral genu varus but no grating and good ROm      Specialty Comments:  No specialty comments available.  Imaging: No results found.   PMFS History: Patient Active Problem List   Diagnosis Date Noted  . HYPERTENSION 09/02/2008  . DYSPNEA 09/02/2008  . DIABETES, TYPE 2 09/01/2008   Past Medical History:  Diagnosis Date  . Diabetes mellitus without complication (HCC)   . Hypertension     History reviewed. No pertinent family history.  History reviewed. No pertinent surgical history. Social History   Occupational History  . Occupation: housekeeping  . Occupation: Surveyor, mining  Tobacco Use  . Smoking status: Former Smoker    Types: Cigarettes, E-cigarettes  . Smokeless tobacco: Current User  Substance and Sexual Activity  . Alcohol use: No  . Drug use: Not on file  . Sexual activity: Not on file

## 2020-03-30 ENCOUNTER — Ambulatory Visit (INDEPENDENT_AMBULATORY_CARE_PROVIDER_SITE_OTHER): Payer: Medicare HMO | Admitting: Physical Medicine and Rehabilitation

## 2020-03-30 ENCOUNTER — Ambulatory Visit: Payer: Self-pay

## 2020-03-30 ENCOUNTER — Other Ambulatory Visit: Payer: Self-pay

## 2020-03-30 VITALS — BP 126/79 | HR 88

## 2020-03-30 DIAGNOSIS — M5416 Radiculopathy, lumbar region: Secondary | ICD-10-CM | POA: Diagnosis not present

## 2020-03-30 DIAGNOSIS — M4316 Spondylolisthesis, lumbar region: Secondary | ICD-10-CM

## 2020-03-30 DIAGNOSIS — M48062 Spinal stenosis, lumbar region with neurogenic claudication: Secondary | ICD-10-CM

## 2020-03-30 MED ORDER — DEXAMETHASONE SODIUM PHOSPHATE 10 MG/ML IJ SOLN
15.0000 mg | Freq: Once | INTRAMUSCULAR | Status: AC
  Administered 2020-03-30: 15 mg

## 2020-03-30 NOTE — Progress Notes (Signed)
Pt states pain is in the lower back down both legs. Pt states nothing makes pain better. Pt states everything makes pain worse.  Numeric Pain Rating Scale and Functional Assessment Average Pain (9)   In the last MONTH (on 0-10 scale) has pain interfered with the following?  1. General activity like being  able to carry out your everyday physical activities such as walking, climbing stairs, carrying groceries, or moving a chair?  Rating(9)   +Driver, -BT, -Dye Allergies.

## 2020-03-31 NOTE — Procedures (Signed)
Lumbosacral Transforaminal Epidural Steroid Injection - Sub-Pedicular Approach with Fluoroscopic Guidance  Patient: Amanda Barban V.      Date of Birth: 11/25/1955 MRN: [id number] PCP: Fleet Contras, MD      Visit Date: 03/30/2020   Universal Protocol:    Date/Time: 03/30/2020  Consent Given By: the patient  Position: PRONE  Additional Comments: Vital signs were monitored before and after the procedure. Patient was prepped and draped in the usual sterile fashion. The correct patient, procedure, and site was verified.   Injection Procedure Details:  Procedure Site One Meds Administered:  Meds ordered this encounter  Medications  . dexamethasone (DECADRON) injection 15 mg    Laterality: Bilateral  Location/Site:  L3-L4  Needle size: 22 G  Needle type: Spinal  Needle Placement: Transforaminal  Findings:    -Comments: Excellent flow of contrast along the nerve and into the epidural space.  Procedure Details: After squaring off the end-plates to get a true AP view, the C-arm was positioned so that an oblique view of the foramen as noted above was visualized. The target area is just inferior to the "nose of the scotty dog" or sub pedicular. The soft tissues overlying this structure were infiltrated with 2-3 ml. of 1% Lidocaine without Epinephrine.  The spinal needle was inserted toward the target using a "trajectory" view along the fluoroscope beam.  Under AP and lateral visualization, the needle was advanced so it did not puncture dura and was located close the 6 O'Clock position of the pedical in AP tracterory. Biplanar projections were used to confirm position. Aspiration was confirmed to be negative for CSF and/or blood. A 1-2 ml. volume of Isovue-250 was injected and flow of contrast was noted at each level. Radiographs were obtained for documentation purposes.   After attaining the desired flow of contrast documented above, a 0.5 to 1.0 ml test dose of 0.25% Marcaine  was injected into each respective transforaminal space.  The patient was observed for 90 seconds post injection.  After no sensory deficits were reported, and normal lower extremity motor function was noted,   the above injectate was administered so that equal amounts of the injectate were placed at each foramen (level) into the transforaminal epidural space.   Additional Comments:  The patient tolerated the procedure well Dressing: 2 x 2 sterile gauze and Band-Aid    Post-procedure details: Patient was observed during the procedure. Post-procedure instructions were reviewed.  Patient left the clinic in stable condition.

## 2020-03-31 NOTE — Progress Notes (Signed)
[name] - 64 y.o. female MRN [id number]  Date of birth: 29-May-1956  Office Visit Note: Visit Date: 03/30/2020 PCP: Nolene Ebbs, MD Referred by: Nolene Ebbs, MD  Subjective: Chief Complaint  Patient presents with  . Lower Back - Pain   HPI:  Amanda Hatfield is a 65 y.o. female who comes in today At the request of Dr. Basil Dess for planned Bilateral L3-L4 lumbar transforaminal epidural steroid injection with fluoroscopic guidance.  The patient has failed conservative care including home exercise, medications, time and activity modification.  This injection will be diagnostic and hopefully therapeutic.  Please see requesting physician notes for further details and justification.   Patient's case complicated by significant anxiety, obesity and diabetes.  Patient really needs prior Valium or other sedation for the procedure.  She did okay completing the procedure today.  ROS Otherwise per HPI.  Assessment & Plan: Visit Diagnoses:  1. Lumbar radiculopathy   2. Spinal stenosis of lumbar region with neurogenic claudication   3. Spondylolisthesis of lumbar region     Plan: No additional findings.   Meds & Orders:  Meds ordered this encounter  Medications  . dexamethasone (DECADRON) injection 15 mg    Orders Placed This Encounter  Procedures  . XR C-ARM NO REPORT  . Epidural Steroid injection    Follow-up: Return for visit to requesting physician as needed.   Procedures: No procedures performed  Lumbosacral Transforaminal Epidural Steroid Injection - Sub-Pedicular Approach with Fluoroscopic Guidance  Patient: Amanda Edison V.      Date of Birth: 09/29/1956 MRN: [id number] PCP: Nolene Ebbs, MD      Visit Date: 03/30/2020   Universal Protocol:    Date/Time: 03/30/2020  Consent Given By: the patient  Position: PRONE  Additional Comments: Vital signs were monitored before and after the procedure. Patient was prepped and draped in the usual sterile  fashion. The correct patient, procedure, and site was verified.   Injection Procedure Details:  Procedure Site One Meds Administered:  Meds ordered this encounter  Medications  . dexamethasone (DECADRON) injection 15 mg    Laterality: Bilateral  Location/Site:  L3-L4  Needle size: 22 G  Needle type: Spinal  Needle Placement: Transforaminal  Findings:    -Comments: Excellent flow of contrast along the nerve and into the epidural space.  Procedure Details: After squaring off the end-plates to get a true AP view, the C-arm was positioned so that an oblique view of the foramen as noted above was visualized. The target area is just inferior to the "nose of the scotty dog" or sub pedicular. The soft tissues overlying this structure were infiltrated with 2-3 ml. of 1% Lidocaine without Epinephrine.  The spinal needle was inserted toward the target using a "trajectory" view along the fluoroscope beam.  Under AP and lateral visualization, the needle was advanced so it did not puncture dura and was located close the 6 O'Clock position of the pedical in AP tracterory. Biplanar projections were used to confirm position. Aspiration was confirmed to be negative for CSF and/or blood. A 1-2 ml. volume of Isovue-250 was injected and flow of contrast was noted at each level. Radiographs were obtained for documentation purposes.   After attaining the desired flow of contrast documented above, a 0.5 to 1.0 ml test dose of 0.25% Marcaine was injected into each respective transforaminal space.  The patient was observed for 90 seconds post injection.  After no sensory deficits were reported, and normal lower extremity motor function was  noted,   the above injectate was administered so that equal amounts of the injectate were placed at each foramen (level) into the transforaminal epidural space.   Additional Comments:  The patient tolerated the procedure well Dressing: 2 x 2 sterile gauze and Band-Aid     Post-procedure details: Patient was observed during the procedure. Post-procedure instructions were reviewed.  Patient left the clinic in stable condition.      Clinical History: CLINICAL DATA:  Additional history provided by technologist: Patient reports low back pain for years, numbness and tingling down both legs into feet.  EXAM: MRI LUMBAR SPINE WITHOUT CONTRAST  TECHNIQUE: Multiplanar, multisequence MR imaging of the lumbar spine was performed. No intravenous contrast was administered.  COMPARISON:  Radiographs of the lumbar spine 01/27/2020, lumbar spine MRI 03/14/2011  FINDINGS: Mild intermittent motion degradation.  Segmentation: For the purposes of this dictation, five lumbar vertebrae are assumed and the caudal most well-formed intervertebral disc is designated L5-S1.  Alignment: Straightening of the expected lumbar lordosis. L2-L3 grade 1 anterolisthesis. Trace L3-L4 and L4-L5 anterolisthesis.  Vertebrae: Vertebral body height is maintained. Edema signal within the bilateral L4 pedicles (greater on the right) and right L5 pedicle, which may be degenerative and related to adjacent facet arthrosis. Stress reaction may also have this imaging appearance. Mild multilevel predominantly fatty degenerative endplate marrow signal.  Conus medullaris and cauda equina: Conus extends to the L1-L2 level. 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  Paraspinal and other soft tissues: Incompletely assessed right renal cysts. Atrophy of the lumbar paraspinal musculature.   IMPRESSION: Lumbar spondylosis as outlined and progressed at several levels since prior MRI 03/15/2011. Findings are most notably as follows.  At L3-L4, there is progressive multifactorial severe spinal canal stenosis now with near complete effacement of the thecal sac. As before, a disc protrusion  contributes to moderate right neural foraminal narrowing.  At L2-L3, progressive multifactorial moderately severe spinal canal stenosis with right greater than left subarticular narrowing. There is also progressive mild bilateral neural foraminal narrowing at this level.  At L4-L5, progressive multifactorial moderate central canal stenosis. As before, a broad-based right subarticular/foraminal disc protrusion contributes to moderate right subarticular and neural foraminal narrowing.   Electronically Signed   By: Jackey Loge DO   On: 02/14/2020 21:17     Objective:  VS:  HT:    WT:   BMI:     BP:126/79  HR:88bpm  TEMP: ( )  RESP:  Physical Exam Constitutional:      General: She is not in acute distress.    Appearance: Normal appearance. She is obese. She is not ill-appearing.  HENT:     Head: Normocephalic and atraumatic.     Right Ear: External ear normal.     Left Ear: External ear normal.  Eyes:     Extraocular Movements: Extraocular movements intact.  Cardiovascular:     Rate and Rhythm: Normal rate.     Pulses: Normal pulses.  Musculoskeletal:     Right lower leg: No edema.     Left lower leg: No edema.     Comments: Patient has good distal strength with no pain over the greater trochanters.  No clonus or focal weakness.  Skin:    Findings: No erythema, lesion or rash.  Neurological:     General: No focal deficit present.     Mental Status: She is alert and oriented to person, place, and time.  Sensory: No sensory deficit.     Motor: No weakness or abnormal muscle tone.     Coordination: Coordination normal.  Psychiatric:        Mood and Affect: Mood normal.        Behavior: Behavior normal.      Imaging: XR C-ARM NO REPORT  Result Date: 03/30/2020 Please see Notes tab for imaging impression.

## 2020-04-05 ENCOUNTER — Ambulatory Visit (INDEPENDENT_AMBULATORY_CARE_PROVIDER_SITE_OTHER): Payer: Medicare HMO | Admitting: Specialist

## 2020-04-05 ENCOUNTER — Encounter: Payer: Self-pay | Admitting: Specialist

## 2020-04-05 ENCOUNTER — Other Ambulatory Visit: Payer: Self-pay

## 2020-04-05 VITALS — BP 112/74 | HR 91 | Ht 62.0 in | Wt 215.0 lb

## 2020-04-05 DIAGNOSIS — M4316 Spondylolisthesis, lumbar region: Secondary | ICD-10-CM | POA: Diagnosis not present

## 2020-04-05 DIAGNOSIS — M25551 Pain in right hip: Secondary | ICD-10-CM

## 2020-04-05 DIAGNOSIS — M5416 Radiculopathy, lumbar region: Secondary | ICD-10-CM | POA: Diagnosis not present

## 2020-04-05 DIAGNOSIS — M48062 Spinal stenosis, lumbar region with neurogenic claudication: Secondary | ICD-10-CM | POA: Diagnosis not present

## 2020-04-05 NOTE — Patient Instructions (Signed)
Avoid bending, stooping and avoid lifting weights greater than 10 lbs. Avoid prolong standing and walking. Avoid frequent bending and stooping  No lifting greater than 10 lbs. May use ice or moist heat for pain. Weight loss is of benefit. Handicap license is approved. If the pain returns call us and speak with [facility] my assistant an we would then have Dr. Bradford Blas secretary/Assistant will call to arrange for epidural steroid injection repeated bilaterally at L3-4. You can have as many as 3 sets of injections every 6 months if they successfully relief your pain and restore function. In the event of weakness injections may not help.

## 2020-04-05 NOTE — Progress Notes (Signed)
Office Visit Note   Patient: Amanda Hatfield.           Date of Birth: 04/07/1956           MRN: [id number] Visit Date: 04/05/2020              Requested by: Nolene Ebbs, MD [address] [place],  [place] [address] PCP: Nolene Ebbs, MD   Assessment & Plan: Visit Diagnoses:  1. Spinal stenosis of lumbar region with neurogenic claudication   2. Spondylolisthesis, lumbar region   3. Pain in right hip   4. Radiculopathy, lumbar region     Plan: Avoid bending, stooping and avoid lifting weights greater than 10 lbs. Avoid prolong standing and walking. Avoid frequent bending and stooping  No lifting greater than 10 lbs. May use ice or moist heat for pain. Weight loss is of benefit. Handicap license is approved. If the pain returns call us and speak with [facility] my assistant an we would then have Dr. Romona Curls secretary/Assistant will call to arrange for epidural steroid injection repeated bilaterally at L3-4. You can have as many as 3 sets of injections every 6 months if they successfully relief your pain and restore function. In the event of weakness injections may not help.   Follow-Up Instructions: No follow-ups on file.   Orders:  No orders of the defined types were placed in this encounter.  No orders of the defined types were placed in this encounter.     Procedures: No procedures performed   Clinical Data: No additional findings.   Subjective: Chief Complaint  Patient presents with  . Lower Back - Follow-up    63 year old female with history of lumbar spinal stenosis with spondylolisthesis at L3-4. She has had ESI by Dr. Ernestina Patches bilateral L3-4 transforamenal ESIs for spinal stenosis. She reports relief of pain in back and legs is as much as 85 %. Since the injection she has noted a small amount of right medial knee discomfort and some amount of left low back pain but otherwise she is not hurting.   Review of Systems  Constitutional: Negative.   HENT:  Negative.   Eyes: Negative.   Respiratory: Negative.   Cardiovascular: Negative.   Gastrointestinal: Negative.   Endocrine: Negative.   Genitourinary: Negative.   Musculoskeletal: Negative.   Skin: Negative.   Allergic/Immunologic: Negative.   Neurological: Negative.   Hematological: Negative.   Psychiatric/Behavioral: Negative.      Objective: Vital Signs: BP 112/74 (BP Location: Left Arm, Patient Position: Sitting)   Pulse 91   Ht 5\' 2"  (1.575 m)   Wt 215 lb (97.5 kg)   BMI 39.32 kg/m   Physical Exam Constitutional:      Appearance: She is well-developed.  HENT:     Head: Normocephalic and atraumatic.  Eyes:     Pupils: Pupils are equal, round, and reactive to light.  Pulmonary:     Effort: Pulmonary effort is normal.     Breath sounds: Normal breath sounds.  Abdominal:     General: Bowel sounds are normal.     Palpations: Abdomen is soft.  Musculoskeletal:     Cervical back: Normal range of motion and neck supple.     Lumbar back: Negative right straight leg raise test and negative left straight leg raise test.  Skin:    General: Skin is warm and dry.  Neurological:     Mental Status: She is alert and oriented to person, place, and time.  Psychiatric:  Behavior: Behavior normal.        Thought Content: Thought content normal.        Judgment: Judgment normal.     Back Exam   Tenderness  The patient is experiencing tenderness in the lumbar.  Range of Motion  Extension: abnormal  Flexion: abnormal  Lateral bend right: abnormal  Lateral bend left: abnormal  Rotation right: abnormal  Rotation left: abnormal   Muscle Strength  Right Quadriceps:  5/5  Left Quadriceps:  5/5  Right Hamstrings:  5/5  Left Hamstrings:  5/5   Tests  Straight leg raise right: negative Straight leg raise left: negative  Reflexes  Patellar: abnormal Achilles: abnormal Babinski's sign: normal   Other  Toe walk: normal Heel walk: normal Sensation:  normal Gait: normal       Specialty Comments:  No specialty comments available.  Imaging: No results found.   PMFS History: Patient Active Problem List   Diagnosis Date Noted  . HYPERTENSION 09/02/2008  . DYSPNEA 09/02/2008  . DIABETES, TYPE 2 09/01/2008   Past Medical History:  Diagnosis Date  . Diabetes mellitus without complication (HCC)   . Hypertension     No family history on file.  No past surgical history on file. Social History   Occupational History  . Occupation: housekeeping  . Occupation: Surveyor, mining  Tobacco Use  . Smoking status: Former Smoker    Types: Cigarettes, E-cigarettes  . Smokeless tobacco: Current User  Substance and Sexual Activity  . Alcohol use: No  . Drug use: Not on file  . Sexual activity: Not on file

## 2020-05-31 ENCOUNTER — Ambulatory Visit: Payer: Medicare HMO | Admitting: Specialist

## 2020-05-31 ENCOUNTER — Telehealth: Payer: Self-pay | Admitting: Specialist

## 2020-05-31 NOTE — Telephone Encounter (Signed)
I have put her on the cancellation list and we will call if there is a sooner appt

## 2020-05-31 NOTE — Telephone Encounter (Signed)
Spoke with patient she advised she was running late for her appointment and I  had to reschedule her appointment today.Patient asked to be put on the wait list for earlier appointment. The number to contact patient is [phone]

## 2020-06-12 ENCOUNTER — Ambulatory Visit: Payer: Medicare HMO | Admitting: Specialist

## 2020-06-26 ENCOUNTER — Ambulatory Visit: Payer: Medicare HMO | Admitting: Specialist

## 2020-07-24 ENCOUNTER — Ambulatory Visit (INDEPENDENT_AMBULATORY_CARE_PROVIDER_SITE_OTHER): Payer: Medicare HMO | Admitting: Specialist

## 2020-07-24 ENCOUNTER — Other Ambulatory Visit: Payer: Self-pay

## 2020-07-24 ENCOUNTER — Encounter: Payer: Self-pay | Admitting: Specialist

## 2020-07-24 VITALS — BP 148/82 | HR 89 | Ht 62.0 in | Wt 217.0 lb

## 2020-07-24 DIAGNOSIS — M48062 Spinal stenosis, lumbar region with neurogenic claudication: Secondary | ICD-10-CM

## 2020-07-24 DIAGNOSIS — M4316 Spondylolisthesis, lumbar region: Secondary | ICD-10-CM

## 2020-07-24 DIAGNOSIS — M5416 Radiculopathy, lumbar region: Secondary | ICD-10-CM | POA: Diagnosis not present

## 2020-07-24 NOTE — Progress Notes (Addendum)
Office Visit Note   Patient: Amanda Hatfield.           Date of Birth: 01-Feb-1956           MRN: [id number] Visit Date: 07/24/2020              Requested by: Fleet Contras, MD [address] [place],  [place] [address] PCP: Fleet Contras, MD   Assessment & Plan: Visit Diagnoses:  1. Spinal stenosis of lumbar region with neurogenic claudication   2. Spondylolisthesis, lumbar region   3. Radiculopathy, lumbar region     Plan:Avoid bending, stooping and avoid lifting weights greater than 10 lbs. Avoid prolong standing and walking. Order for a new walker with wheels. Surgery scheduling secretary Tivis Ringer, will call you in the next week to schedule for surgery.  Surgery recommended is a decompression L2-3, L3-4 and L4-5 with three level lumbar fusion L2-3,  L3-4 and L4-5 this would be done with rods, screws and cages with local bone graft and allograft (donor bone graft). Take hydrocodone for for pain. Risk of surgery includes risk of infection 1 in 200 patients, bleeding 1/2% chance you would need a transfusion.   Risk to the nerves is one in 10,000. You will need to use a brace for 3 months and wean from the brace on the 4th month. Expect improved walking and standing tolerance. Expect relief of leg pain but numbness may persist depending on the length and degree of pressure that has been present.    Follow-Up Instructions: No follow-ups on file.   Orders:  No orders of the defined types were placed in this encounter.  No orders of the defined types were placed in this encounter.     Procedures: No procedures performed   Clinical Data: Findings:  Narrative & Impression CLINICAL DATA:  Radiculopathy, lumbar region. Worsening low back pain, right lower extremity radiculopathy, neurogenic claudication; low back pain, greater than 6 weeks; lumbar radiculopathy, greater than 6 weeks, low back pain, progressive neurologic deficit. Additional history provided by  technologist: Patient reports low back pain for years, numbness and tingling down both legs into feet.  EXAM: MRI LUMBAR SPINE WITHOUT CONTRAST  TECHNIQUE: Multiplanar, multisequence MR imaging of the lumbar spine was performed. No intravenous contrast was administered.  COMPARISON:  Radiographs of the lumbar spine 01/27/2020, lumbar spine MRI 03/14/2011  FINDINGS: Mild intermittent motion degradation.  Segmentation: For the purposes of this dictation, five lumbar vertebrae are assumed and the caudal most well-formed intervertebral disc is designated L5-S1.  Alignment: Straightening of the expected lumbar lordosis. L2-L3 grade 1 anterolisthesis. Trace L3-L4 and L4-L5 anterolisthesis.  Vertebrae: Vertebral body height is maintained. Edema signal within the bilateral L4 pedicles (greater on the right) and right L5 pedicle, which may be degenerative and related to adjacent facet arthrosis. Stress reaction may also have this imaging appearance. Mild multilevel predominantly fatty degenerative endplate marrow signal.  Conus medullaris and cauda equina: Conus extends to the L1-L2 level. 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  Paraspinal and other soft tissues: Incompletely assessed right renal cysts. Atrophy of the lumbar paraspinal musculature.  Disc levels:  Unless otherwise stated, the level by level findings below have not significantly changed since prior MRI 03/15/2011.  Mild to moderate L2-L3 disc degeneration, progressed. Mild disc degeneration at the remaining levels is similar to the prior exam.  T11-T12: This level is not included on axial imaging. Small disc  bulge with redemonstrated small left foraminal disc protrusion. No more than mild spinal canal stenosis. Incompletely assessed left neural foraminal narrowing.  T12-L1: Small disc bulge. A small superimposed  central disc protrusion is new from prior exam. Mild relative narrowing of the spinal canal without nerve root impingement. No significant foraminal stenosis.  L1-L2: Mild facet arthrosis. No significant disc herniation, spinal canal stenosis or neural foraminal narrowing.  L2-L3: Grade 1 anterolisthesis is new from prior examination. Disc bulge, progressed. Advanced facet arthrosis with ligamentum flavum hypertrophy, progressed. 3 mm ventrally projecting synovial facet cyst on the left new from prior exam (series 8, image 17). Moderately severe spinal canal stenosis with right greater than left subarticular narrowing, progressed. Mild bilateral neural foraminal narrowing, progressed.  L3-L4: Grade 1 anterolisthesis. Disc bulge, progressed. Superimposed broad-based right foraminal/extraforaminal disc protrusion. Advanced facet arthrosis with ligamentum flavum hypertrophy, progressed. Severe spinal canal stenosis with near complete effacement of the thecal sac, progressed. Unchanged moderate right neural foraminal narrowing.  L4-L5: Disc bulge with endplate spurring. Superimposed right subarticular/foraminal disc protrusion. Moderate facet arthrosis with ligamentum flavum hypertrophy. Small amount of asymmetric fluid within the right facet joint. Moderate right with mild left subarticular stenosis, not significantly changed. Progressive moderate central canal stenosis. Unchanged moderate right neural foraminal narrowing.  L5-S1: Disc bulge. Central zone posterior annular fissure. Moderate facet arthrosis/ligamentum flavum hypertrophy. Minimal bilateral subarticular narrowing without nerve root impingement. Central canal patent. Mild bilateral neural foraminal narrowing (greater on the right).  IMPRESSION: Lumbar spondylosis as outlined and progressed at several levels since prior MRI 03/15/2011. Findings are most notably as follows.  At L3-L4, there is progressive  multifactorial severe spinal canal stenosis now with near complete effacement of the thecal sac. As before, a disc protrusion contributes to moderate right neural foraminal narrowing.  At L2-L3, progressive multifactorial moderately severe spinal canal stenosis with right greater than left subarticular narrowing. There is also progressive mild bilateral neural foraminal narrowing at this level.  At L4-L5, progressive multifactorial moderate central canal stenosis. As before, a broad-based right subarticular/foraminal disc protrusion contributes to moderate right subarticular and neural foraminal narrowing.   Electronically Signed   By: Jackey Loge DO   On: 02/14/2020 21:17      Subjective: Chief Complaint  Patient presents with  . Lower Back - Follow-up    64 year old female with bilateral foot numbness and pain with radiation into the big toes. There is a little bladder difficulty, bowel is constipated. She is not walking long distances but is able to grocery shop will leaning on a grocery cart. Pain in the back and legs is such that she is not able to leave the home but for short periods and is having to sit to relieve pain into the buttocks and thighs after less than 5 minutes of standing. The numbness into the toes is becoming more constant. There is pain also with prolong sitting.     Review of Systems  Constitutional: Negative.   HENT: Negative.   Eyes: Negative.   Respiratory: Negative.   Cardiovascular: Negative.   Gastrointestinal: Negative.   Endocrine: Negative.   Genitourinary: Negative.   Musculoskeletal: Negative.   Skin: Negative.   Allergic/Immunologic: Negative.   Neurological: Negative.   Hematological: Negative.   Psychiatric/Behavioral: Negative.      Objective: Vital Signs: BP (!) 148/82 (BP Location: Left Arm, Patient Position: Sitting)   Pulse 89   Ht 5\' 2"  (1.575 m)   Wt 217 lb (98.4 kg)   BMI 39.69  kg/m   Physical  Exam Constitutional:      Appearance: She is well-developed.  HENT:     Head: Normocephalic and atraumatic.  Eyes:     Pupils: Pupils are equal, round, and reactive to light.  Pulmonary:     Effort: Pulmonary effort is normal.     Breath sounds: Normal breath sounds.  Abdominal:     General: Bowel sounds are normal.     Palpations: Abdomen is soft.  Musculoskeletal:     Cervical back: Normal range of motion and neck supple.     Lumbar back: Positive right straight leg raise test and positive left straight leg raise test.  Skin:    General: Skin is warm and dry.  Neurological:     Mental Status: She is alert and oriented to person, place, and time.  Psychiatric:        Behavior: Behavior normal.        Thought Content: Thought content normal.        Judgment: Judgment normal.     Back Exam   Tenderness  The patient is experiencing tenderness in the lumbar.  Range of Motion  Extension: abnormal  Flexion: abnormal  Lateral bend right: abnormal  Lateral bend left: abnormal  Rotation right: abnormal  Rotation left: abnormal   Muscle Strength  Right Quadriceps:  5/5  Left Quadriceps:  5/5  Right Hamstrings:  5/5  Left Hamstrings:  5/5   Tests  Straight leg raise right: positive Straight leg raise left: positive  Reflexes  Patellar: normal Achilles: normal  Other  Toe walk: abnormal Heel walk: abnormal Sensation: normal Gait: normal  Erythema: no back redness Scars: absent      Specialty Comments:  No specialty comments available.  Imaging: No results found.   PMFS History: Patient Active Problem List   Diagnosis Date Noted  . HYPERTENSION 09/02/2008  . DYSPNEA 09/02/2008  . DIABETES, TYPE 2 09/01/2008   Past Medical History:  Diagnosis Date  . Diabetes mellitus without complication (HCC)   . Hypertension     History reviewed. No pertinent family history.  History reviewed. No pertinent surgical history. Social History   Occupational  History  . Occupation: housekeeping  . Occupation: Surveyor, mining  Tobacco Use  . Smoking status: Former Smoker    Types: Cigarettes, E-cigarettes  . Smokeless tobacco: Current User  Vaping Use  . Vaping Use: Former  Substance and Sexual Activity  . Alcohol use: No  . Drug use: Not on file  . Sexual activity: Not on file

## 2020-07-24 NOTE — Patient Instructions (Signed)
Plan:Avoid bending, stooping and avoid lifting weights greater than 10 lbs. Avoid prolong standing and walking. Order for a new walker with wheels. Surgery scheduling secretary Tivis Ringer, will call you in the next week to schedule for surgery.  Surgery recommended is a decompression L2-3, L3-4 and L4-5 with two level lumbar fusion L3-4 and L4-5 this would be done with rods, screws and cages with local bone graft and allograft (donor bone graft). Take hydrocodone for for pain. Risk of surgery includes risk of infection 1 in 200 patients, bleeding 1/2% chance you would need a transfusion.   Risk to the nerves is one in 10,000. You will need to use a brace for 3 months and wean from the brace on the 4th month. Expect improved walking and standing tolerance. Expect relief of leg pain but numbness may persist depending on the length and degree of pressure that has been present.

## 2020-08-04 HISTORY — PX: BREAST BIOPSY: SHX20

## 2020-08-07 ENCOUNTER — Other Ambulatory Visit: Payer: Self-pay | Admitting: Internal Medicine

## 2020-08-07 DIAGNOSIS — Z1231 Encounter for screening mammogram for malignant neoplasm of breast: Secondary | ICD-10-CM

## 2020-08-08 ENCOUNTER — Other Ambulatory Visit: Payer: Self-pay

## 2020-08-08 ENCOUNTER — Encounter (HOSPITAL_BASED_OUTPATIENT_CLINIC_OR_DEPARTMENT_OTHER): Payer: Self-pay | Admitting: Oral Surgery

## 2020-08-11 ENCOUNTER — Encounter (HOSPITAL_BASED_OUTPATIENT_CLINIC_OR_DEPARTMENT_OTHER)
Admission: RE | Admit: 2020-08-11 | Discharge: 2020-08-11 | Disposition: A | Discharge status: Home / Self Care | Payer: Medicare HMO | Source: Ambulatory Visit | Attending: Oral Surgery | Admitting: Oral Surgery

## 2020-08-11 ENCOUNTER — Other Ambulatory Visit (HOSPITAL_COMMUNITY)
Admission: RE | Admit: 2020-08-11 | Discharge: 2020-08-11 | Disposition: A | Discharge status: Home / Self Care | Payer: Medicare HMO | Source: Ambulatory Visit | Attending: Oral Surgery | Admitting: Oral Surgery

## 2020-08-11 DIAGNOSIS — Z01818 Encounter for other preprocedural examination: Secondary | ICD-10-CM | POA: Insufficient documentation

## 2020-08-11 DIAGNOSIS — Z20822 Contact with and (suspected) exposure to covid-19: Secondary | ICD-10-CM | POA: Diagnosis not present

## 2020-08-11 DIAGNOSIS — I10 Essential (primary) hypertension: Secondary | ICD-10-CM | POA: Insufficient documentation

## 2020-08-11 DIAGNOSIS — E119 Type 2 diabetes mellitus without complications: Secondary | ICD-10-CM | POA: Insufficient documentation

## 2020-08-11 LAB — BASIC METABOLIC PANEL
Anion gap: 11 (ref 5–15)
BUN: 14 mg/dL (ref 8–23)
CO2: 26 mmol/L (ref 22–32)
Calcium: 9.4 mg/dL (ref 8.9–10.3)
Chloride: 101 mmol/L (ref 98–111)
Creatinine, Ser: 1.23 mg/dL — ABNORMAL HIGH (ref 0.44–1.00)
GFR calc non Af Amer: 46 mL/min — ABNORMAL LOW (ref >60)
Glucose, Bld: 202 mg/dL — ABNORMAL HIGH (ref 70–99)
Potassium: 4.4 mmol/L (ref 3.5–5.1)
Sodium: 138 mmol/L (ref 135–145)

## 2020-08-11 LAB — SARS CORONAVIRUS 2 (TAT 6-24 HRS): SARS Coronavirus 2: NEGATIVE

## 2020-08-14 NOTE — H&P (Signed)
HISTORY AND PHYSICAL  Amanda Hatfield. is a 64 y.o. female patient referred by general dentist for pre-prosthetic surgery.  No diagnosis found.  Past Medical History:  Diagnosis Date   Diabetes mellitus without complication (HCC)    Hypertension     No current facility-administered medications for this encounter.   Current Outpatient Medications  Medication Sig Dispense Refill   aspirin EC 81 MG tablet Take 81 mg by mouth daily.     cetirizine (ZYRTEC) 10 MG tablet 1 BY MOUTH EVERY DAY AS NEEDED every morning  9   fluticasone (FLONASE) 50 MCG/ACT nasal spray 2 sprays each nostril EVERY DAY 120/4=30)  2   gabapentin (NEURONTIN) 300 MG capsule 1 cap BY MOUTH THREE TIMES DAILY (morning,noon,evening)  9   glimepiride (AMARYL) 4 MG tablet Take 1 tablet by mouth daily.     INVOKANA 300 MG TABS tablet Take 300 mg by mouth every morning.  5   losartan (COZAAR) 50 MG tablet      MYRBETRIQ 50 MG TB24 tablet Take 50 mg by mouth every evening.  5   phentermine (ADIPEX-P) 37.5 MG tablet Take 37.5 mg by mouth every morning.  2   simvastatin (ZOCOR) 40 MG tablet Take 1 tablet by mouth daily.     tiZANidine (ZANAFLEX) 4 MG tablet Take 1 tablet by mouth 2 (two) times daily.     TRULICITY 0.75 MG/0.5ML SOPN SUBCUTANEOUSLY injection once weekly  2   Vitamin D, Ergocalciferol, (DRISDOL) 50000 units CAPS capsule take 1 capsule each week on monday morning  5   diclofenac Sodium (VOLTAREN) 1 % GEL      No Known Allergies Active Problems:   * No active hospital problems. *  Vitals: Height 5\' 2"  (1.575 m), weight 95.3 kg. Lab results:No results found for this or any previous visit (from the past 24 hour(s)). Radiology Results: No results found. General appearance: alert, cooperative, no distress and morbidly obese Head: Normocephalic, without obvious abnormality, atraumatic Nose: Nares normal. Septum midline. Mucosa normal. No drainage or sinus tenderness. Throat: Carious teeth #7,  24, 25, large bilateral mandibular lingual tori. Pharynx clear. No trismus.  Neck: no adenopathy and supple, symmetrical, trachea midline Resp: clear to auscultation bilaterally Cardio: regular rate and rhythm, S1, S2 normal, no murmur, click, rub or gallop  Assessment: Non-restorable teeth 7, 24, 25, bilateral mandibular lingual tori.  Plan: Dental extractions, removal mandibular lingual tori. GA. Day surgery.   08/14/2020

## 2020-08-15 ENCOUNTER — Encounter (HOSPITAL_BASED_OUTPATIENT_CLINIC_OR_DEPARTMENT_OTHER): Payer: Self-pay | Admitting: Oral Surgery

## 2020-08-15 ENCOUNTER — Encounter (HOSPITAL_BASED_OUTPATIENT_CLINIC_OR_DEPARTMENT_OTHER)
Admission: RE | Disposition: A | Discharge status: Home / Self Care | Payer: Self-pay | Source: Home / Self Care | Attending: Oral Surgery

## 2020-08-15 ENCOUNTER — Ambulatory Visit (HOSPITAL_BASED_OUTPATIENT_CLINIC_OR_DEPARTMENT_OTHER)
Admission: RE | Admit: 2020-08-15 | Discharge: 2020-08-15 | Disposition: A | Discharge status: Home / Self Care | Payer: Medicare HMO | Attending: Oral Surgery | Admitting: Oral Surgery

## 2020-08-15 ENCOUNTER — Other Ambulatory Visit: Payer: Self-pay

## 2020-08-15 ENCOUNTER — Ambulatory Visit (HOSPITAL_BASED_OUTPATIENT_CLINIC_OR_DEPARTMENT_OTHER): Payer: Medicare HMO | Admitting: Anesthesiology

## 2020-08-15 DIAGNOSIS — I10 Essential (primary) hypertension: Secondary | ICD-10-CM | POA: Insufficient documentation

## 2020-08-15 DIAGNOSIS — Z7984 Long term (current) use of oral hypoglycemic drugs: Secondary | ICD-10-CM | POA: Diagnosis not present

## 2020-08-15 DIAGNOSIS — K0889 Other specified disorders of teeth and supporting structures: Secondary | ICD-10-CM | POA: Insufficient documentation

## 2020-08-15 DIAGNOSIS — Z79899 Other long term (current) drug therapy: Secondary | ICD-10-CM | POA: Insufficient documentation

## 2020-08-15 DIAGNOSIS — Z87891 Personal history of nicotine dependence: Secondary | ICD-10-CM | POA: Diagnosis not present

## 2020-08-15 DIAGNOSIS — M270 Developmental disorders of jaws: Secondary | ICD-10-CM | POA: Insufficient documentation

## 2020-08-15 DIAGNOSIS — Z6839 Body mass index (BMI) 39.0-39.9, adult: Secondary | ICD-10-CM | POA: Diagnosis not present

## 2020-08-15 DIAGNOSIS — Z7982 Long term (current) use of aspirin: Secondary | ICD-10-CM | POA: Diagnosis not present

## 2020-08-15 DIAGNOSIS — E119 Type 2 diabetes mellitus without complications: Secondary | ICD-10-CM | POA: Insufficient documentation

## 2020-08-15 HISTORY — PX: TOOTH EXTRACTION: SHX859

## 2020-08-15 LAB — GLUCOSE, CAPILLARY
Glucose-Capillary: 213 mg/dL — ABNORMAL HIGH (ref 70–99)
Glucose-Capillary: 251 mg/dL — ABNORMAL HIGH (ref 70–99)

## 2020-08-15 SURGERY — DENTAL RESTORATION/EXTRACTIONS
Anesthesia: General | Site: Mouth

## 2020-08-15 MED ORDER — LIDOCAINE HCL (CARDIAC) PF 100 MG/5ML IV SOSY
PREFILLED_SYRINGE | INTRAVENOUS | Status: DC | PRN
  Administered 2020-08-15: 60 mg via INTRAVENOUS

## 2020-08-15 MED ORDER — DEXAMETHASONE SODIUM PHOSPHATE 4 MG/ML IJ SOLN
INTRAMUSCULAR | Status: DC | PRN
  Administered 2020-08-15: 4 mg via INTRAVENOUS

## 2020-08-15 MED ORDER — SUGAMMADEX SODIUM 200 MG/2ML IV SOLN
INTRAVENOUS | Status: DC | PRN
  Administered 2020-08-15: 200 mg via INTRAVENOUS

## 2020-08-15 MED ORDER — ONDANSETRON HCL 4 MG/2ML IJ SOLN: 4.0000 mg | Freq: Once | INTRAMUSCULAR | Status: DC | PRN

## 2020-08-15 MED ORDER — AMISULPRIDE (ANTIEMETIC) 5 MG/2ML IV SOLN: 10.0000 mg | Freq: Once | INTRAVENOUS | Status: DC | PRN

## 2020-08-15 MED ORDER — CEFAZOLIN SODIUM-DEXTROSE 2-4 GM/100ML-% IV SOLN
2.0000 g | INTRAVENOUS | Status: AC
  Administered 2020-08-15: 2 g via INTRAVENOUS

## 2020-08-15 MED ORDER — OXYCODONE HCL 5 MG PO TABS: 5.0000 mg | ORAL_TABLET | Freq: Once | ORAL | Status: DC | PRN

## 2020-08-15 MED ORDER — FENTANYL CITRATE (PF) 100 MCG/2ML IJ SOLN
INTRAMUSCULAR | Status: AC
  Filled 2020-08-15: qty 2

## 2020-08-15 MED ORDER — LACTATED RINGERS IV SOLN: INTRAVENOUS | Status: DC

## 2020-08-15 MED ORDER — SODIUM CHLORIDE 0.9 % IV SOLN
INTRAVENOUS | Status: AC | PRN
  Administered 2020-08-15: 500 mL via INTRAMUSCULAR

## 2020-08-15 MED ORDER — PROPOFOL 10 MG/ML IV BOLUS
INTRAVENOUS | Status: DC | PRN
  Administered 2020-08-15: 140 mg via INTRAVENOUS

## 2020-08-15 MED ORDER — CEFAZOLIN SODIUM-DEXTROSE 2-4 GM/100ML-% IV SOLN
INTRAVENOUS | Status: AC
  Filled 2020-08-15: qty 100

## 2020-08-15 MED ORDER — OXYCODONE-ACETAMINOPHEN 5-325 MG PO TABS: 1.0000 | ORAL_TABLET | ORAL | 0 refills | Status: DC | PRN

## 2020-08-15 MED ORDER — ONDANSETRON HCL 4 MG/2ML IJ SOLN
INTRAMUSCULAR | Status: DC | PRN
  Administered 2020-08-15: 4 mg via INTRAVENOUS

## 2020-08-15 MED ORDER — LIDOCAINE-EPINEPHRINE 2 %-1:100000 IJ SOLN
INTRAMUSCULAR | Status: DC | PRN
  Administered 2020-08-15: 20 mL via INTRADERMAL

## 2020-08-15 MED ORDER — MIDAZOLAM HCL 2 MG/2ML IJ SOLN
INTRAMUSCULAR | Status: AC
  Filled 2020-08-15: qty 2

## 2020-08-15 MED ORDER — AMOXICILLIN 500 MG PO CAPS: 500.0000 mg | ORAL_CAPSULE | Freq: Three times a day (TID) | ORAL | 0 refills | Status: AC

## 2020-08-15 MED ORDER — MIDAZOLAM HCL 5 MG/5ML IJ SOLN
INTRAMUSCULAR | Status: DC | PRN
  Administered 2020-08-15: 2 mg via INTRAVENOUS

## 2020-08-15 MED ORDER — FENTANYL CITRATE (PF) 100 MCG/2ML IJ SOLN
25.0000 ug | INTRAMUSCULAR | Status: DC | PRN
  Administered 2020-08-15: 50 ug via INTRAVENOUS

## 2020-08-15 MED ORDER — EPHEDRINE 5 MG/ML INJ
INTRAVENOUS | Status: AC
  Filled 2020-08-15: qty 10

## 2020-08-15 MED ORDER — PROPOFOL 10 MG/ML IV BOLUS
INTRAVENOUS | Status: AC
  Filled 2020-08-15: qty 20

## 2020-08-15 MED ORDER — OXYCODONE HCL 5 MG/5ML PO SOLN: 5.0000 mg | Freq: Once | ORAL | Status: DC | PRN

## 2020-08-15 MED ORDER — EPHEDRINE SULFATE 50 MG/ML IJ SOLN
INTRAMUSCULAR | Status: DC | PRN
  Administered 2020-08-15: 5 mg via INTRAVENOUS

## 2020-08-15 MED ORDER — ROCURONIUM BROMIDE 100 MG/10ML IV SOLN
INTRAVENOUS | Status: DC | PRN
  Administered 2020-08-15: 40 mg via INTRAVENOUS

## 2020-08-15 SURGICAL SUPPLY — 45 items
BLADE SURG 15 STRL LF DISP TIS (BLADE) ×1 IMPLANT
BLADE SURG 15 STRL SS (BLADE) ×3
BNDG COHESIVE 2X5 TAN STRL LF (GAUZE/BANDAGES/DRESSINGS) IMPLANT
BNDG EYE OVAL (GAUZE/BANDAGES/DRESSINGS) IMPLANT
BUR CROSS CUT FISSURE 1.6 (BURR) ×2 IMPLANT
BUR CROSS CUT FISSURE 1.6MM (BURR) ×1
BUR EGG ELITE 4.0 (BURR) ×1 IMPLANT
BUR EGG ELITE 4.0MM (BURR) ×1
CANISTER SUCT 1200ML W/VALVE (MISCELLANEOUS) ×3 IMPLANT
CATH ROBINSON RED A/P 10FR (CATHETERS) IMPLANT
COVER BACK TABLE 60X90IN (DRAPES) ×3 IMPLANT
COVER MAYO STAND STRL (DRAPES) ×3 IMPLANT
COVER WAND RF STERILE (DRAPES) IMPLANT
DECANTER SPIKE VIAL GLASS SM (MISCELLANEOUS) IMPLANT
DRAPE U-SHAPE 76X120 STRL (DRAPES) ×3 IMPLANT
GAUZE PACKING FOLDED 2  STR (GAUZE/BANDAGES/DRESSINGS) ×3
GAUZE PACKING FOLDED 2 STR (GAUZE/BANDAGES/DRESSINGS) ×1 IMPLANT
GAUZE PACKING IODOFORM 1/4X15 (PACKING) IMPLANT
GLOVE BIO SURGEON STRL SZ 6.5 (GLOVE) ×2 IMPLANT
GLOVE BIO SURGEON STRL SZ7.5 (GLOVE) ×3 IMPLANT
GLOVE BIO SURGEONS STRL SZ 6.5 (GLOVE) ×1
GLOVE BIOGEL PI IND STRL 6.5 (GLOVE) ×1 IMPLANT
GLOVE BIOGEL PI INDICATOR 6.5 (GLOVE) ×2
GOWN STRL REUS W/ TWL LRG LVL3 (GOWN DISPOSABLE) ×1 IMPLANT
GOWN STRL REUS W/ TWL XL LVL3 (GOWN DISPOSABLE) ×1 IMPLANT
GOWN STRL REUS W/TWL LRG LVL3 (GOWN DISPOSABLE) ×3
GOWN STRL REUS W/TWL XL LVL3 (GOWN DISPOSABLE) ×3
IV NS 500ML (IV SOLUTION) ×3
IV NS 500ML BAXH (IV SOLUTION) ×1 IMPLANT
NEEDLE HYPO 22GX1.5 SAFETY (NEEDLE) ×3 IMPLANT
NS IRRIG 1000ML POUR BTL (IV SOLUTION) ×3 IMPLANT
PACK BASIN DAY SURGERY FS (CUSTOM PROCEDURE TRAY) ×3 IMPLANT
SLEEVE IRRIGATION ELITE 7 (MISCELLANEOUS) ×3 IMPLANT
SLEEVE SCD COMPRESS KNEE MED (MISCELLANEOUS) IMPLANT
SPONGE SURGIFOAM ABS GEL 12-7 (HEMOSTASIS) IMPLANT
SUT CHROMIC 3 0 PS 2 (SUTURE) ×3 IMPLANT
SYR BULB EAR ULCER 3OZ GRN STR (SYRINGE) ×3 IMPLANT
SYR CONTROL 10ML LL (SYRINGE) ×3 IMPLANT
TOOTHBRUSH ADULT (PERSONAL CARE ITEMS) IMPLANT
TOWEL GREEN STERILE FF (TOWEL DISPOSABLE) ×3 IMPLANT
TRAY DSU PREP LF (CUSTOM PROCEDURE TRAY) IMPLANT
TUBE CONNECTING 20'X1/4 (TUBING) ×1
TUBE CONNECTING 20X1/4 (TUBING) ×2 IMPLANT
TUBING IRRIGATION (MISCELLANEOUS) ×3 IMPLANT
YANKAUER SUCT BULB TIP NO VENT (SUCTIONS) ×3 IMPLANT

## 2020-08-15 NOTE — Discharge Instructions (Signed)

## 2020-08-15 NOTE — Anesthesia Procedure Notes (Signed)
Procedure Name: Intubation Date/Time: 08/15/2020 9:04 AM Performed by: Maryella Shivers, CRNA Pre-anesthesia Checklist: Patient identified, Emergency Drugs available, Suction available and Patient being monitored Patient Re-evaluated:Patient Re-evaluated prior to induction Oxygen Delivery Method: Circle system utilized Preoxygenation: Pre-oxygenation with 100% oxygen Induction Type: IV induction Ventilation: Mask ventilation without difficulty Laryngoscope Size: Mac and 3 Grade View: Grade I Nasal Tubes: Nasal prep performed and Nasal Rae Tube size: 6.5 mm Placement Confirmation: ETT inserted through vocal cords under direct vision,  positive ETCO2 and breath sounds checked- equal and bilateral Secured at: 27 cm Tube secured with: Tape Dental Injury: Teeth and Oropharynx as per pre-operative assessment

## 2020-08-15 NOTE — H&P (Signed)
H&P documentation  -History and Physical Reviewed  -Patient has been re-examined  -No change in the plan of care  Alson Mcpheeters  

## 2020-08-15 NOTE — Anesthesia Preprocedure Evaluation (Signed)
Anesthesia Evaluation  Patient identified by MRN, date of birth, ID band Patient awake    Reviewed: Allergy & Precautions, NPO status , Patient's Chart, lab work & pertinent test results  History of Anesthesia Complications Negative for: history of anesthetic complications  Airway Mallampati: II  TM Distance: >3 FB Neck ROM: Full    Dental  (+) Poor Dentition   Pulmonary neg pulmonary ROS, former smoker,    Pulmonary exam normal        Cardiovascular hypertension, Normal cardiovascular exam     Neuro/Psych negative neurological ROS  negative psych ROS   GI/Hepatic negative GI ROS, Neg liver ROS,   Endo/Other  diabetes, Type obesity  Renal/GU Renal disease (Cr 1.23)  negative genitourinary   Musculoskeletal negative musculoskeletal ROS (+)   Abdominal   Peds  Hematology negative hematology ROS (+)   Anesthesia Other Findings   Reproductive/Obstetrics                             Anesthesia Physical Anesthesia Plan  ASA: III  Anesthesia Plan: General   Post-op Pain Management:    Induction: Intravenous  PONV Risk Score and Plan: Ondansetron, Dexamethasone, Treatment may vary due to age or medical condition and Midazolam  Airway Management Planned: Nasal ETT  Additional Equipment: None  Intra-op Plan:   Post-operative Plan: Extubation in OR  Informed Consent: I have reviewed the patients History and Physical, chart, labs and discussed the procedure including the risks, benefits and alternatives for the proposed anesthesia with the patient or authorized representative who has indicated his/her understanding and acceptance.     Dental advisory given  Plan Discussed with:   Anesthesia Plan Comments:         Anesthesia Quick Evaluation

## 2020-08-15 NOTE — Op Note (Signed)
08/15/2020  9:44 AM  PATIENT:  Amanda Hatfield  64 y.o. female  PRE-OPERATIVE DIAGNOSIS:  NON RESTORABLE TEETH # 7, 24, 25, BILATERAL MANDIBULAR LINGUAL TORI  POST-OPERATIVE DIAGNOSIS:  SAME  PROCEDURE:  Procedure(s): /EXTRACTION TEETH # 7, 24, 25, REMOVAL BILATERAL MANDIBULAR LINGUAL TORI    SURGEON:  Surgeon(s): Ocie Doyne, DDS  ANESTHESIA:   local and general  EBL:  minimal  DRAINS: none   SPECIMEN:  No Specimen  COUNTS:  YES  PLAN OF CARE: Discharge to home after PACU  PATIENT DISPOSITION:  PACU - hemodynamically stable.   PROCEDURE DETAILS: Dictation # [id number]  Georgia Lopes, DMD 08/15/2020 9:44 AM

## 2020-08-15 NOTE — Anesthesia Postprocedure Evaluation (Signed)
Anesthesia Post Note  Patient: Amanda Hatfield  Procedure(s) Performed: DENTAL RESTORATION/EXTRACTIONS (N/A Mouth)     Patient location during evaluation: PACU Anesthesia Type: General Level of consciousness: awake and alert Pain management: pain level controlled Vital Signs Assessment: post-procedure vital signs reviewed and stable Respiratory status: spontaneous breathing, nonlabored ventilation and respiratory function stable Cardiovascular status: blood pressure returned to baseline and stable Postop Assessment: no apparent nausea or vomiting Anesthetic complications: no   No complications documented.  Last Vitals:  Vitals:   08/15/20 1042 08/15/20 1100  BP:  (!) 147/79  Pulse: 84 87  Resp: 15 16  Temp:  (!) 36.3 C  SpO2: 96% 100%    Last Pain:  Vitals:   08/15/20 1042  TempSrc:   PainSc: 2                  Lucretia Kern

## 2020-08-15 NOTE — Transfer of Care (Signed)
Immediate Anesthesia Transfer of Care Note  Patient: Amanda Hatfield  Procedure(s) Performed: DENTAL RESTORATION/EXTRACTIONS (N/A Mouth)  Patient Location: PACU  Anesthesia Type:General  Level of Consciousness: sedated  Airway & Oxygen Therapy: Patient Spontanous Breathing and Patient connected to face mask oxygen  Post-op Assessment: Report given to RN and Post -op Vital signs reviewed and stable  Post vital signs: Reviewed and stable  Last Vitals:  Vitals Value Taken Time  BP 139/74 08/15/20 0957  Temp    Pulse 91 08/15/20 1000  Resp 15 08/15/20 1000  SpO2 97 % 08/15/20 1000  Vitals shown include unvalidated device data.  Last Pain:  Vitals:   08/15/20 0755  TempSrc: Oral  PainSc: 4          Complications: No complications documented.

## 2020-08-15 NOTE — Op Note (Signed)
NAME: Amanda Hatfield, Amanda Hatfield MEDICAL RECORD KN:[id number] ACCOUNT [id number] DATE OF BIRTH:11/27/1955 FACILITY: [facility] LOCATION: [facility] PHYSICIAN:Azaya Goedde M. Nil Bolser, DDS  OPERATIVE REPORT  DATE OF PROCEDURE:  08/15/2020  PREOPERATIVE DIAGNOSES:  Nonrestorable teeth #7, 24, 25.  Bilateral mandibular lingual tori.  POSTOPERATIVE DIAGNOSES:  Nonrestorable teeth #7, 24, 25.  Bilateral mandibular lingual tori.   PROCEDURE:  Extraction teeth #7, 24, 25, removal of bilateral lingual tori.  SURGEON:  Ocie Doyne, DDS  ANESTHESIA:  General, nasal intubation.  Attending was [place].  DESCRIPTION OF PROCEDURE:  The patient was taken to the operating room and placed on the table in supine position.  General anesthesia was administered and a nasal endotracheal tube was placed and secured.  The eyes were protected.  The patient was  draped for surgery.  A timeout was performed.  The posterior pharynx was suctioned and a throat pack was placed.  2% lidocaine 1:100,000 epinephrine was infiltrated in an inferior alveolar block on the right and left sides of the mandible and buccal  infiltration was administered in the anterior mandible.  Additional anesthesia was deposited buccally in the area of tooth #7 as well as palatally.  Then, the bite block was placed on the right side of the mouth.  A sweetheart retractor was used to  retract the tongue.  A 15 blade used to make an incision around tooth #7, 24, 25.  The teeth were elevated with a periosteal elevator, and then removed with the dental forceps.  The sockets were curetted.  No sutures were needed.  A 15 blade used to make  an incision beginning in the posterior aspect of the mandible on the left side in the area of tooth #18 and carried forward lingually in the gingival sulcus until the socket of tooth #24 was encountered and then the incision across the socket of tooth  #25 was done, as well.  This was reflected and the lingual flap to expose the alveolar  torus which was bulbous, approximately 3 cm x 1 cm.  Dissection was carried inferiorly onto bone and then the floor of the mouth tissues were retracted with a  sweetheart retractor.  Then, the Stryker handpiece with the egg-shaped bur was used under irrigation to remove the lingual torus.  Then, the area was irrigated.  It was manually palpated and found to have good contour.  Then, the area was sutured with a  3-0 chromic.  Then, the bite block and sweetheart retractor were repositioned to the other side of the mouth.  Another lingual sulcular incision was created beginning in the area of the mandibular right 2nd molar along the alveolar crest and then when  tooth #29 was encountered, the incision was created in the lingual sulcus until the joint with a midline incision created earlier.  Then, the periosteum was reflected, the lingual flap was reflected, and retracted with the sweetheart retractor and then  the Stryker handpiece with egg bur under irrigation was used to remove the torus.  It was similar in size as to the left side and that this was removed atraumatically and then the area was irrigated and closed with 3-0 chromic.  Then, the oral cavity was  irrigated and suctioned.  The throat pack was removed.  The patient was left in care of anesthesia for extubation with plans for discharge home after recovery room today.  ESTIMATED BLOOD LOSS:  Minimal.  COMPLICATIONS:  None.  SPECIMENS:  None.  CN/NUANCE  D:08/15/2020 T:08/15/2020 JOB:[id number]

## 2020-08-16 ENCOUNTER — Encounter (HOSPITAL_BASED_OUTPATIENT_CLINIC_OR_DEPARTMENT_OTHER): Payer: Self-pay | Admitting: Oral Surgery

## 2020-08-25 ENCOUNTER — Other Ambulatory Visit: Payer: Self-pay

## 2020-08-25 ENCOUNTER — Ambulatory Visit
Admission: RE | Admit: 2020-08-25 | Discharge: 2020-08-25 | Disposition: A | Discharge status: Home / Self Care | Payer: Medicare HMO | Source: Ambulatory Visit | Attending: Internal Medicine | Admitting: Internal Medicine

## 2020-08-25 DIAGNOSIS — Z1231 Encounter for screening mammogram for malignant neoplasm of breast: Secondary | ICD-10-CM

## 2020-08-30 ENCOUNTER — Other Ambulatory Visit: Payer: Self-pay | Admitting: Internal Medicine

## 2020-08-30 DIAGNOSIS — R928 Other abnormal and inconclusive findings on diagnostic imaging of breast: Secondary | ICD-10-CM

## 2020-08-31 ENCOUNTER — Ambulatory Visit (INDEPENDENT_AMBULATORY_CARE_PROVIDER_SITE_OTHER): Payer: Medicare HMO | Admitting: Specialist

## 2020-08-31 ENCOUNTER — Other Ambulatory Visit: Payer: Self-pay

## 2020-08-31 ENCOUNTER — Encounter: Payer: Self-pay | Admitting: Specialist

## 2020-08-31 VITALS — BP 117/78 | HR 102 | Ht 62.0 in | Wt 215.5 lb

## 2020-08-31 DIAGNOSIS — M5416 Radiculopathy, lumbar region: Secondary | ICD-10-CM | POA: Diagnosis not present

## 2020-08-31 DIAGNOSIS — M4316 Spondylolisthesis, lumbar region: Secondary | ICD-10-CM | POA: Diagnosis not present

## 2020-08-31 DIAGNOSIS — M545 Low back pain, unspecified: Secondary | ICD-10-CM | POA: Diagnosis not present

## 2020-08-31 DIAGNOSIS — M48062 Spinal stenosis, lumbar region with neurogenic claudication: Secondary | ICD-10-CM | POA: Diagnosis not present

## 2020-08-31 DIAGNOSIS — E1142 Type 2 diabetes mellitus with diabetic polyneuropathy: Secondary | ICD-10-CM

## 2020-08-31 DIAGNOSIS — G8929 Other chronic pain: Secondary | ICD-10-CM

## 2020-08-31 DIAGNOSIS — Z794 Long term (current) use of insulin: Secondary | ICD-10-CM

## 2020-08-31 NOTE — Patient Instructions (Signed)
Plan:Avoid bending, stooping and avoid lifting weights greater than 10 lbs. Avoid prolong standing and walking. Order for a new walker with wheels. Surgery scheduling secretary Tivis Ringer, will call you in the next week to schedule for surgery.  Surgery recommended is a decompression L2-3, L3-4 and L4-5 with three level lumbar fusion L2-3,  L3-4 and L4-5 this would be done with rods, screws and cages with local bone graft and allograft (donor bone graft). Take hydrocodone for for pain. Risk of surgery includes risk of infection 1 in 200 patients, bleeding 1/2% chance you would need a transfusion.   Risk to the nerves is one in 10,000. You will need to use a brace for 3 months and wean from the brace on the 4th month. Expect improved walking and standing tolerance. Expect relief of leg pain but numbness may persist depending on the length and degree of pressure that has been present. Unfortunately her Hgb A1 c remains greater than 8. This is not controlled and increases the risk of surgical treatment.  She also has had recent dental surgery and hopefully this will be stable before we schedule intervention for her lumbar spine.

## 2020-08-31 NOTE — Progress Notes (Signed)
Office Visit Note   Patient: Amanda Hatfield.           Date of Birth: 1956-01-19           MRN: [id number] Visit Date: 08/31/2020              Requested by: Fleet Contras, MD [address] [place],  [place] [address] PCP: Fleet Contras, MD   Assessment & Plan: Visit Diagnoses:  1. Spondylolisthesis, lumbar region   2. Spinal stenosis of lumbar region with neurogenic claudication   3. Radiculopathy, lumbar region   4. Chronic low back pain, unspecified back pain laterality, unspecified whether sciatica present     Plan: Plan:Avoid bending, stooping and avoid lifting weights greater than 10 lbs. Avoid prolong standing and walking. Order for a new walker with wheels. Surgery scheduling secretary Tivis Ringer, will call you in the next week to schedule for surgery.  Surgery recommended is a decompression L2-3, L3-4 and L4-5 with three level lumbar fusion L2-3,  L3-4 and L4-5 this would be done with rods, screws and cages with local bone graft and allograft (donor bone graft). Take hydrocodone for for pain. Risk of surgery includes risk of infection 1 in 200 patients, bleeding 1/2% chance you would need a transfusion.   Risk to the nerves is one in 10,000. You will need to use a brace for 3 months and wean from the brace on the 4th month. Expect improved walking and standing tolerance. Expect relief of leg pain but numbness may persist depending on the length and degree of pressure that has been present. Unfortunately her Hgb A1 c remains greater than 8. This is not controlled and increases the risk of surgical treatment.  She also has had recent dental surgery and hopefully this will be stable before we schedule intervention for her lumbar spine.   Follow-Up Instructions: Return in about 4 weeks (around 09/28/2020).   Orders:  No orders of the defined types were placed in this encounter.  No orders of the defined types were placed in this encounter.     Procedures: No  procedures performed   Clinical Data: Findings:  Narrative & Impression CLINICAL DATA:  Radiculopathy, lumbar region. Worsening low back pain, right lower extremity radiculopathy, neurogenic claudication; low back pain, greater than 6 weeks; lumbar radiculopathy, greater than 6 weeks, low back pain, progressive neurologic deficit. Additional history provided by technologist: Patient reports low back pain for years, numbness and tingling down both legs into feet.  EXAM: MRI LUMBAR SPINE WITHOUT CONTRAST  TECHNIQUE: Multiplanar, multisequence MR imaging of the lumbar spine was performed. No intravenous contrast was administered.  COMPARISON:  Radiographs of the lumbar spine 01/27/2020, lumbar spine MRI 03/14/2011  FINDINGS: Mild intermittent motion degradation.  Segmentation: For the purposes of this dictation, five lumbar vertebrae are assumed and the caudal most well-formed intervertebral disc is designated L5-S1.  Alignment: Straightening of the expected lumbar lordosis. L2-L3 grade 1 anterolisthesis. Trace L3-L4 and L4-L5 anterolisthesis.  Vertebrae: Vertebral body height is maintained. Edema signal within the bilateral L4 pedicles (greater on the right) and right L5 pedicle, which may be degenerative and related to adjacent facet arthrosis. Stress reaction may also have this imaging appearance. Mild multilevel predominantly fatty degenerative endplate marrow signal.  Conus medullaris and cauda equina: Conus extends to the L1-L2 level. 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  Paraspinal and other soft tissues: Incompletely assessed right renal cysts.  Atrophy of the lumbar paraspinal musculature.  Disc levels:  Unless otherwise stated, the level by level findings below have not significantly changed since prior MRI 03/15/2011.  Mild to moderate L2-L3 disc degeneration,  progressed. Mild disc degeneration at the remaining levels is similar to the prior exam.  T11-T12: This level is not included on axial imaging. Small disc bulge with redemonstrated small left foraminal disc protrusion. No more than mild spinal canal stenosis. Incompletely assessed left neural foraminal narrowing.  T12-L1: Small disc bulge. A small superimposed central disc protrusion is new from prior exam. Mild relative narrowing of the spinal canal without nerve root impingement. No significant foraminal stenosis.  L1-L2: Mild facet arthrosis. No significant disc herniation, spinal canal stenosis or neural foraminal narrowing.  L2-L3: Grade 1 anterolisthesis is new from prior examination. Disc bulge, progressed. Advanced facet arthrosis with ligamentum flavum hypertrophy, progressed. 3 mm ventrally projecting synovial facet cyst on the left new from prior exam (series 8, image 17). Moderately severe spinal canal stenosis with right greater than left subarticular narrowing, progressed. Mild bilateral neural foraminal narrowing, progressed.  L3-L4: Grade 1 anterolisthesis. Disc bulge, progressed. Superimposed broad-based right foraminal/extraforaminal disc protrusion. Advanced facet arthrosis with ligamentum flavum hypertrophy, progressed. Severe spinal canal stenosis with near complete effacement of the thecal sac, progressed. Unchanged moderate right neural foraminal narrowing.  L4-L5: Disc bulge with endplate spurring. Superimposed right subarticular/foraminal disc protrusion. Moderate facet arthrosis with ligamentum flavum hypertrophy. Small amount of asymmetric fluid within the right facet joint. Moderate right with mild left subarticular stenosis, not significantly changed. Progressive moderate central canal stenosis. Unchanged moderate right neural foraminal narrowing.  L5-S1: Disc bulge. Central zone posterior annular fissure. Moderate facet arthrosis/ligamentum  flavum hypertrophy. Minimal bilateral subarticular narrowing without nerve root impingement. Central canal patent. Mild bilateral neural foraminal narrowing (greater on the right).  IMPRESSION: Lumbar spondylosis as outlined and progressed at several levels since prior MRI 03/15/2011. Findings are most notably as follows.  At L3-L4, there is progressive multifactorial severe spinal canal stenosis now with near complete effacement of the thecal sac. As before, a disc protrusion contributes to moderate right neural foraminal narrowing.  At L2-L3, progressive multifactorial moderately severe spinal canal stenosis with right greater than left subarticular narrowing. There is also progressive mild bilateral neural foraminal narrowing at this level.  At L4-L5, progressive multifactorial moderate central canal stenosis. As before, a broad-based right subarticular/foraminal disc protrusion contributes to moderate right subarticular and neural foraminal narrowing.   Electronically Signed   By: Jackey Loge DO   On: 02/14/2020 21:17       Subjective: Chief Complaint  Patient presents with  . Lower Back - Follow-up    64 year old female with lumbar spondylolisthesis and spinal stenosis she is a candidate for 3 level lumbar fusion and decompression. In the interval  She has not been scheduled for her surgey but has had to have surgery on her teeth with extraction of multiple teeth and extensive dental work. That was about 2 weeks ago. She is having difficulty with standing and walking. Grocery shopping with using a cart to lean on. There is pain in the left posterior and lateral thigh. Worse with standing and walking. She has diabetes and unfortunately her control is not good and Hgb A1 c is about 11. This is by report. This needs to be less than 8 or there is a 10 x greater risk of infection.    Review of Systems   Objective: Vital Signs: BP 117/78 (BP  Location: Left  Arm, Patient Position: Sitting)   Pulse (!) 102   Ht 5\' 2"  (1.575 m)   Wt 215 lb 8 oz (97.8 kg)   BMI 39.42 kg/m   Physical Exam Constitutional:      Appearance: She is well-developed.  HENT:     Head: Normocephalic and atraumatic.  Eyes:     Pupils: Pupils are equal, round, and reactive to light.  Pulmonary:     Effort: Pulmonary effort is normal.     Breath sounds: Normal breath sounds.  Abdominal:     General: Bowel sounds are normal.     Palpations: Abdomen is soft.  Musculoskeletal:     Cervical back: Normal range of motion and neck supple.     Lumbar back: Negative right straight leg raise test and negative left straight leg raise test.  Skin:    General: Skin is warm and dry.  Neurological:     Mental Status: She is alert and oriented to person, place, and time.  Psychiatric:        Behavior: Behavior normal.        Thought Content: Thought content normal.        Judgment: Judgment normal.     Back Exam   Tenderness  The patient is experiencing tenderness in the lumbar.  Range of Motion  Extension: abnormal  Flexion: abnormal  Lateral bend right: abnormal  Lateral bend left: abnormal  Rotation right: abnormal  Rotation left: abnormal   Muscle Strength  Right Quadriceps:  4/5  Left Quadriceps:  4/5  Right Hamstrings:  5/5  Left Hamstrings:  5/5   Tests  Straight leg raise right: negative Straight leg raise left: negative  Reflexes  Patellar: 0/4 Achilles: 0/4 Babinski's sign: normal   Other  Toe walk: normal Heel walk: normal Sensation: decreased Gait: abnormal  Erythema: no back redness Scars: absent  Comments:  Weak left greater than right knee extension 4/5, left hip flexion strength is 4/5 and also on the right side though not as bad 5-/5.      Specialty Comments:  No specialty comments available.  Imaging: No results found.   PMFS History: Patient Active Problem List   Diagnosis Date Noted  . HYPERTENSION 09/02/2008  .  DYSPNEA 09/02/2008  . DIABETES, TYPE 2 09/01/2008   Past Medical History:  Diagnosis Date  . Diabetes mellitus without complication (HCC)   . Hypertension     History reviewed. No pertinent family history.  Past Surgical History:  Procedure Laterality Date  . ABDOMINAL HYSTERECTOMY    . FOOT SURGERY    . TOOTH EXTRACTION N/A 08/15/2020   Procedure: DENTAL RESTORATION/EXTRACTIONS;  Surgeon: 10/15/2020, DDS;  Location: [facility];  Service: Oral Surgery;  Laterality: N/A;   Social History   Occupational History  . Occupation: housekeeping  . Occupation: [employer]  Tobacco Use  . Smoking status: Former Smoker    Types: Cigarettes, E-cigarettes  . Smokeless tobacco: Current User  Vaping Use  . Vaping Use: Former  Substance and Sexual Activity  . Alcohol use: No  . Drug use: Never  . Sexual activity: Not on file

## 2020-09-18 ENCOUNTER — Other Ambulatory Visit: Payer: Self-pay | Admitting: Internal Medicine

## 2020-09-18 DIAGNOSIS — R921 Mammographic calcification found on diagnostic imaging of breast: Secondary | ICD-10-CM

## 2020-10-02 ENCOUNTER — Other Ambulatory Visit: Payer: Self-pay

## 2020-10-02 ENCOUNTER — Other Ambulatory Visit: Payer: Self-pay | Admitting: Internal Medicine

## 2020-10-02 ENCOUNTER — Ambulatory Visit
Admission: RE | Admit: 2020-10-02 | Discharge: 2020-10-02 | Disposition: A | Discharge status: Home / Self Care | Payer: Medicare HMO | Source: Ambulatory Visit | Attending: Internal Medicine | Admitting: Internal Medicine

## 2020-10-02 DIAGNOSIS — R921 Mammographic calcification found on diagnostic imaging of breast: Secondary | ICD-10-CM

## 2020-10-02 DIAGNOSIS — R928 Other abnormal and inconclusive findings on diagnostic imaging of breast: Secondary | ICD-10-CM

## 2020-10-04 ENCOUNTER — Ambulatory Visit (INDEPENDENT_AMBULATORY_CARE_PROVIDER_SITE_OTHER): Payer: Medicare HMO | Admitting: Specialist

## 2020-10-04 ENCOUNTER — Other Ambulatory Visit: Payer: Self-pay

## 2020-10-04 ENCOUNTER — Encounter: Payer: Self-pay | Admitting: Specialist

## 2020-10-04 VITALS — BP 127/77 | HR 87 | Ht 62.0 in | Wt 215.5 lb

## 2020-10-04 DIAGNOSIS — M25551 Pain in right hip: Secondary | ICD-10-CM

## 2020-10-04 DIAGNOSIS — M5416 Radiculopathy, lumbar region: Secondary | ICD-10-CM | POA: Diagnosis not present

## 2020-10-04 DIAGNOSIS — M4316 Spondylolisthesis, lumbar region: Secondary | ICD-10-CM | POA: Diagnosis not present

## 2020-10-04 DIAGNOSIS — E1142 Type 2 diabetes mellitus with diabetic polyneuropathy: Secondary | ICD-10-CM

## 2020-10-04 DIAGNOSIS — M48062 Spinal stenosis, lumbar region with neurogenic claudication: Secondary | ICD-10-CM | POA: Diagnosis not present

## 2020-10-04 DIAGNOSIS — Z794 Long term (current) use of insulin: Secondary | ICD-10-CM

## 2020-10-04 NOTE — Patient Instructions (Signed)
Plan:Repeat lumbar MRI as the last study was Avoid bending, stooping and avoid lifting weights greater than 10 lbs. Avoid prolong standing and walking. Order for a new walker with wheels. Surgery scheduling secretary Tivis Ringer, will call you in the next week to schedule for surgery.  Surgery recommended is adecompression L2-3, L3-4 and L4-5 withthreelevel lumbar fusion L2-3,L3-4and L4-5 this would be done with rods, screws and cages with local bone graft and allograft (donor bone graft). Take hydrocodone for for pain. Risk of surgery includes risk of infection 1 in 200 patients, bleeding 1/2% chance you would need a transfusion. Risk to the nerves is one in 10,000. You will need to use a brace for 3 months and wean from the brace on the 4th month. Expect improved walking and standing tolerance. Expect relief of leg pain but numbness may persist depending on the length and degree of pressure that has been present. Unfortunately her Hgb A1 c remains greater than 8. This is not controlled and increases the risk of surgical treatment.  She also has had recent dental surgery and hopefully this will be stable before we schedule intervention for her lumbar spine.

## 2020-10-04 NOTE — Progress Notes (Signed)
Office Visit Note   Patient: Amanda Hatfield.           Date of Birth: 1956-04-23           MRN: [id number] Visit Date: 10/04/2020              Requested by: Fleet Contras, MD [address] [place],  [place] [address] PCP: Fleet Contras, MD   Assessment & Plan: Visit Diagnoses:  1. Spondylolisthesis, lumbar region   2. Spinal stenosis of lumbar region with neurogenic claudication   3. Type 2 diabetes mellitus with diabetic polyneuropathy, with long-term current use of insulin (HCC)   4. Radiculopathy, lumbar region   5. Pain in right hip     Plan:Repeat MRI of the lumbar spine as previous study is from 02/2020.  Avoid bending, stooping and avoid lifting weights greater than 10 lbs. Avoid prolong standing and walking. Order for a new walker with wheels. Surgery scheduling secretary Tivis Ringer, will call you in the next week to schedule for surgery.  Surgery recommended is adecompression L2-3, L3-4 and L4-5 withthreelevel lumbar fusion L2-3,L3-4and L4-5 this would be done with rods, screws and cages with local bone graft and allograft (donor bone graft). Take hydrocodone for for pain. Risk of surgery includes risk of infection 1 in 200 patients, bleeding 1/2% chance you would need a transfusion. Risk to the nerves is one in 10,000. You will need to use a brace for 3 months and wean from the brace on the 4th month. Expect improved walking and standing tolerance. Expect relief of leg pain but numbness may persist depending on the length and degree of pressure that has been present. Unfortunately her Hgb A1 c remains greater than 8. This is not controlled and increases the risk of surgical treatment.  She also has had recent dental surgery and hopefully this will be stable before we schedule intervention for her lumbar spine.    Follow-Up Instructions: Return in about 4 weeks (around 11/01/2020).   Orders:  No orders of the defined types were placed in this  encounter.  No orders of the defined types were placed in this encounter.     Procedures: No procedures performed   Clinical Data: No additional findings.   Subjective: Chief Complaint  Patient presents with  . Lower Back - Follow-up    64 year old female with history of back pain and radiation into the legs left greater than right. She has to lean on the cart when grocery Shopping. Pain with standing and walking and improves with sitting and leaning forward. Radiographs with L2-3, L3-4 and L4-5 anterolistheis with spondylosis of the lumbar spine. She has not bowel or bladder difficulty and presently her BS are much improved.    Review of Systems  Constitutional: Negative.   HENT: Negative.   Eyes: Negative.   Respiratory: Negative.   Cardiovascular: Negative.   Gastrointestinal: Negative.   Endocrine: Negative.   Genitourinary: Negative.   Musculoskeletal: Positive for back pain and gait problem.  Skin: Negative.   Neurological: Positive for weakness and numbness.  Hematological: Negative.   Psychiatric/Behavioral: Negative.      Objective: Vital Signs: BP 127/77 (BP Location: Left Arm, Patient Position: Sitting)   Pulse 87   Ht 5\' 2"  (1.575 m)   Wt 215 lb 8 oz (97.8 kg)   BMI 39.42 kg/m   Physical Exam Constitutional:      Appearance: She is well-developed.  HENT:     Head: Normocephalic and atraumatic.  Eyes:     Pupils: Pupils are equal, round, and reactive to light.  Pulmonary:     Effort: Pulmonary effort is normal.     Breath sounds: Normal breath sounds.  Abdominal:     General: Bowel sounds are normal.     Palpations: Abdomen is soft.  Musculoskeletal:        General: Normal range of motion.     Cervical back: Normal range of motion and neck supple.  Skin:    General: Skin is warm and dry.  Neurological:     Mental Status: She is alert and oriented to person, place, and time.  Psychiatric:        Behavior: Behavior normal.        Thought  Content: Thought content normal.        Judgment: Judgment normal.     Ortho Exam  Specialty Comments:  No specialty comments available.  Imaging: No results found.   PMFS History: Patient Active Problem List   Diagnosis Date Noted  . HYPERTENSION 09/02/2008  . DYSPNEA 09/02/2008  . DIABETES, TYPE 2 09/01/2008   Past Medical History:  Diagnosis Date  . Diabetes mellitus without complication (HCC)   . Hypertension     History reviewed. No pertinent family history.  Past Surgical History:  Procedure Laterality Date  . ABDOMINAL HYSTERECTOMY    . FOOT SURGERY    . TOOTH EXTRACTION N/A 08/15/2020   Procedure: DENTAL RESTORATION/EXTRACTIONS;  Surgeon: Ocie Doyne, DDS;  Location: [facility];  Service: Oral Surgery;  Laterality: N/A;   Social History   Occupational History  . Occupation: housekeeping  . Occupation: Surveyor, mining  Tobacco Use  . Smoking status: Former Smoker    Types: Cigarettes, E-cigarettes  . Smokeless tobacco: Current User  Vaping Use  . Vaping Use: Former  Substance and Sexual Activity  . Alcohol use: No  . Drug use: Never  . Sexual activity: Not on file

## 2020-10-16 ENCOUNTER — Ambulatory Visit
Admission: RE | Admit: 2020-10-16 | Discharge: 2020-10-16 | Disposition: A | Discharge status: Home / Self Care | Payer: Medicare HMO | Source: Ambulatory Visit | Attending: Internal Medicine | Admitting: Internal Medicine

## 2020-10-16 ENCOUNTER — Other Ambulatory Visit: Payer: Self-pay

## 2020-10-16 DIAGNOSIS — R921 Mammographic calcification found on diagnostic imaging of breast: Secondary | ICD-10-CM

## 2020-11-08 ENCOUNTER — Encounter: Payer: Self-pay | Admitting: Specialist

## 2020-11-08 ENCOUNTER — Ambulatory Visit (INDEPENDENT_AMBULATORY_CARE_PROVIDER_SITE_OTHER): Payer: Medicare HMO | Admitting: Specialist

## 2020-11-08 VITALS — BP 153/86 | HR 102 | Ht 62.0 in | Wt 215.5 lb

## 2020-11-08 DIAGNOSIS — M48062 Spinal stenosis, lumbar region with neurogenic claudication: Secondary | ICD-10-CM | POA: Diagnosis not present

## 2020-11-08 DIAGNOSIS — M4807 Spinal stenosis, lumbosacral region: Secondary | ICD-10-CM

## 2020-11-08 DIAGNOSIS — G8929 Other chronic pain: Secondary | ICD-10-CM

## 2020-11-08 DIAGNOSIS — M5416 Radiculopathy, lumbar region: Secondary | ICD-10-CM | POA: Diagnosis not present

## 2020-11-08 DIAGNOSIS — E1142 Type 2 diabetes mellitus with diabetic polyneuropathy: Secondary | ICD-10-CM | POA: Diagnosis not present

## 2020-11-08 DIAGNOSIS — M4316 Spondylolisthesis, lumbar region: Secondary | ICD-10-CM

## 2020-11-08 DIAGNOSIS — Z794 Long term (current) use of insulin: Secondary | ICD-10-CM

## 2020-11-08 DIAGNOSIS — M545 Low back pain, unspecified: Secondary | ICD-10-CM

## 2020-11-08 NOTE — Progress Notes (Signed)
Office Visit Note   Patient: Amanda Hatfield.           Date of Birth: February 04, 1956           MRN: [id number] Visit Date: 11/08/2020              Requested by: Amanda Contras, MD [address] [place],  [place] [address] PCP: Amanda Contras, MD   Assessment & Plan: Visit Diagnoses:  1. Spondylolisthesis, lumbar region   2. Spinal stenosis of lumbar region with neurogenic claudication   3. Type 2 diabetes mellitus with diabetic polyneuropathy, with long-term current use of insulin (HCC)   4. Radiculopathy, lumbar region   5. Chronic low back pain, unspecified back pain laterality, unspecified whether sciatica present   6. Spinal stenosis of lumbosacral region   7. Spondylolisthesis of lumbar region     Plan:Repeat MRI of the lumbar spine as previous study is from 02/2020.  Avoid bending, stooping and avoid lifting weights greater than 10 lbs. Avoid prolong standing and walking. Order for a new walker with wheels. Surgery scheduling secretary Amanda Hatfield, will call you in the next week to schedule for surgery.  Surgery recommended is adecompression L2-3, L3-4 and L4-5 withthreelevel lumbar fusion L2-3,L3-4and L4-5 this would be done with rods, screws and cages with local bone graft and allograft (donor bone graft). Take hydrocodone for for pain. Risk of surgery includes risk of infection 1 in 200 patients, bleeding 1/2% chance you would need a transfusion. Risk to the nerves is one in 10,000. You will need to use a brace for 3 months and wean from the brace on the 4th month. Expect improved walking and standing tolerance. Expect relief of leg pain but numbness may persist depending on the length and degree of pressure that has been present. Hgb A1 c needs to be less than 8. This is better controlled now and decreases the risk of surgical treatment. She also has had recent dental surgery and this will be stable. We will schedule intervention for her lumbar spine, with  repeat MRI and documentation of improved HgbA1c.    Follow-Up Instructions: Return in about 3 weeks (around 11/29/2020).   Orders:  No orders of the defined types were placed in this encounter.  No orders of the defined types were placed in this encounter.     Procedures: No procedures performed   Clinical Data: No additional findings.   Subjective: Chief Complaint  Patient presents with  . Lower Back - Follow-up    65 year old female with history of diabetes and lumbar DDD. She is here today with no improvement in her back pain. No bowel or bladder difficulty, takes meds for bowels for constipation. No bladder issues. She has been undergoing medical treatment for her DM Type2 now on insulin injections in addition to oral antihyperglycemic meds. She sees Dr. Concepcion Hatfield and he is to see her again in 2 weeks. Pain is back and the legs. She is not able to grocery shop without leaning on the cart, usually sends her Amanda Hatfield to pick up groceries.    Review of Systems  Constitutional: Negative.   HENT: Negative.   Eyes: Negative.   Respiratory: Negative.   Cardiovascular: Negative.   Gastrointestinal: Negative.   Endocrine: Negative.   Genitourinary: Negative.   Musculoskeletal: Negative.   Skin: Negative.   Allergic/Immunologic: Negative.   Neurological: Negative.   Hematological: Negative.   Psychiatric/Behavioral: Negative.      Objective: Vital Signs: BP (!) 153/86 (  BP Location: Left Arm, Patient Position: Sitting)   Pulse (!) 102   Ht 5\' 2"  (1.575 m)   Wt 215 lb 8 oz (97.8 kg)   BMI 39.42 kg/m   Physical Exam Constitutional:      Appearance: She is well-developed and well-nourished.  HENT:     Head: Normocephalic and atraumatic.  Eyes:     Extraocular Movements: EOM normal.     Pupils: Pupils are equal, round, and reactive to light.  Pulmonary:     Effort: Pulmonary effort is normal.     Breath sounds: Normal breath sounds.  Abdominal:     General:  Bowel sounds are normal.     Palpations: Abdomen is soft.  Musculoskeletal:     Cervical back: Normal range of motion and neck supple.     Lumbar back: Negative right straight leg raise test and negative left straight leg raise test.  Skin:    General: Skin is warm and dry.  Neurological:     Mental Status: She is alert and oriented to person, place, and time.  Psychiatric:        Mood and Affect: Mood and affect normal.        Behavior: Behavior normal.        Thought Content: Thought content normal.        Judgment: Judgment normal.     Back Exam   Tenderness  The patient is experiencing tenderness in the lumbar.  Range of Motion  Extension: abnormal  Flexion: abnormal  Lateral bend right: abnormal  Lateral bend left: abnormal  Rotation right: abnormal  Rotation left: abnormal   Muscle Strength  Right Quadriceps:  5/5  Left Quadriceps:  5/5  Right Hamstrings:  5/5  Left Hamstrings:  5/5   Tests  Straight leg raise right: negative Straight leg raise left: negative  Reflexes  Patellar: 0/4 Achilles: 0/4 Babinski's sign: normal   Other  Toe walk: normal Heel walk: normal Sensation: normal Erythema: no back redness Scars: absent      Specialty Comments:  No specialty comments available.  Imaging: No results found.   PMFS History: Patient Active Problem List   Diagnosis Date Noted  . HYPERTENSION 09/02/2008  . DYSPNEA 09/02/2008  . DIABETES, TYPE 2 09/01/2008   Past Medical History:  Diagnosis Date  . Diabetes mellitus without complication (HCC)   . Hypertension     History reviewed. No pertinent family history.  Past Surgical History:  Procedure Laterality Date  . ABDOMINAL HYSTERECTOMY    . FOOT SURGERY    . TOOTH EXTRACTION N/A 08/15/2020   Procedure: DENTAL RESTORATION/EXTRACTIONS;  Surgeon: 10/15/2020, DDS;  Location: [facility];  Service: Oral Surgery;  Laterality: N/A;   Social History   Occupational History   . Occupation: housekeeping  . Occupation: Amanda Hatfield  Tobacco Use  . Smoking status: Former Smoker    Types: Cigarettes, E-cigarettes  . Smokeless tobacco: Current User  Vaping Use  . Vaping Use: Former  Substance and Sexual Activity  . Alcohol use: No  . Drug use: Never  . Sexual activity: Not on file

## 2020-11-08 NOTE — Patient Instructions (Signed)
Plan:Repeat MRI of the lumbar spine as previous study is from 02/2020.  Avoid bending, stooping and avoid lifting weights greater than 10 lbs. Avoid prolong standing and walking. Order for a new walker with wheels. Surgery scheduling secretary Tivis Ringer, will call you in the next week to schedule for surgery.  Surgery recommended is adecompression L2-3, L3-4 and L4-5 withthreelevel lumbar fusion L2-3,L3-4and L4-5 this would be done with rods, screws and cages with local bone graft and allograft (donor bone graft). Take hydrocodone for for pain. Risk of surgery includes risk of infection 1 in 200 patients, bleeding 1/2% chance you would need a transfusion. Risk to the nerves is one in 10,000. You will need to use a brace for 3 months and wean from the brace on the 4th month. Expect improved walking and standing tolerance. Expect relief of leg pain but numbness may persist depending on the length and degree of pressure that has been present. Hgb A1 c needs to be less than 8. This is better controlled now and decreases the risk of surgical treatment. She also has had recent dental surgery and this will be stable. We will schedule intervention for her lumbar spine, with repeat MRI and documentation of improved HgbA1c.

## 2020-11-09 LAB — HEMOGLOBIN A1C
Hgb A1c MFr Bld: 8.3 % of total Hgb — ABNORMAL HIGH (ref <5.7)
Mean Plasma Glucose: 192 mg/dL
eAG (mmol/L): 10.6 mmol/L

## 2020-11-17 ENCOUNTER — Ambulatory Visit
Admission: RE | Admit: 2020-11-17 | Discharge: 2020-11-17 | Disposition: A | Discharge status: Home / Self Care | Payer: Medicare HMO | Source: Ambulatory Visit | Attending: Specialist | Admitting: Specialist

## 2020-11-17 ENCOUNTER — Other Ambulatory Visit: Payer: Self-pay

## 2020-11-17 DIAGNOSIS — M4807 Spinal stenosis, lumbosacral region: Secondary | ICD-10-CM

## 2020-11-17 DIAGNOSIS — M4316 Spondylolisthesis, lumbar region: Secondary | ICD-10-CM

## 2020-12-01 ENCOUNTER — Other Ambulatory Visit: Payer: Self-pay

## 2020-12-01 ENCOUNTER — Ambulatory Visit (INDEPENDENT_AMBULATORY_CARE_PROVIDER_SITE_OTHER): Payer: Medicare HMO | Admitting: Specialist

## 2020-12-01 ENCOUNTER — Encounter: Payer: Self-pay | Admitting: Specialist

## 2020-12-01 VITALS — BP 143/79 | HR 102 | Ht 62.0 in | Wt 215.5 lb

## 2020-12-01 DIAGNOSIS — G8929 Other chronic pain: Secondary | ICD-10-CM

## 2020-12-01 DIAGNOSIS — M5416 Radiculopathy, lumbar region: Secondary | ICD-10-CM

## 2020-12-01 DIAGNOSIS — M4316 Spondylolisthesis, lumbar region: Secondary | ICD-10-CM | POA: Diagnosis not present

## 2020-12-01 DIAGNOSIS — M545 Low back pain, unspecified: Secondary | ICD-10-CM

## 2020-12-01 DIAGNOSIS — Z794 Long term (current) use of insulin: Secondary | ICD-10-CM

## 2020-12-01 DIAGNOSIS — E1142 Type 2 diabetes mellitus with diabetic polyneuropathy: Secondary | ICD-10-CM | POA: Diagnosis not present

## 2020-12-01 DIAGNOSIS — M4807 Spinal stenosis, lumbosacral region: Secondary | ICD-10-CM

## 2020-12-01 DIAGNOSIS — M48062 Spinal stenosis, lumbar region with neurogenic claudication: Secondary | ICD-10-CM | POA: Diagnosis not present

## 2020-12-01 DIAGNOSIS — M25551 Pain in right hip: Secondary | ICD-10-CM

## 2020-12-01 NOTE — Progress Notes (Signed)
Office Visit Note   Patient: Amanda Hatfield.           Date of Birth: 06-Nov-1955           MRN: [id number] Visit Date: 12/01/2020              Requested by: Fleet Contras, MD [address] [place],  [place] [address] PCP: Fleet Contras, MD   Assessment & Plan: Visit Diagnoses:  1. Spondylolisthesis, lumbar region   2. Spinal stenosis of lumbar region with neurogenic claudication   3. Type 2 diabetes mellitus with diabetic polyneuropathy, with long-term current use of insulin (HCC)   4. Radiculopathy, lumbar region   5. Chronic low back pain, unspecified back pain laterality, unspecified whether sciatica present   6. Spinal stenosis of lumbosacral region   7. Spondylolisthesis of lumbar region   8. Pain in right hip     Plan:Avoid bending, stooping and avoid lifting weights greater than 10 lbs. Avoid prolong standing and walking. Order for a new walker with wheels. We are going to wait as your pain and dysfunction due to your back is not as bad. Work on improving your sugars helps Decease risk to the nerves, make them less sensitive to surgical manipulation and also decreases the risk of infection and complications Due to intervention. If you are improving then we can wait till you are ready to consider surgery and also when the hospitals are less Under stress due to the recent spike in COVID and you are less likely to acquire the virus by being in the hospital.    Surgery recommended is adecompression L2-3, L3-4 and L4-5 withthreelevel lumbar fusion L2-3,L3-4and L4-5 this would be done with rods, screws and cages with local bone graft and allograft (donor bone graft). Take hydrocodone for for pain. Risk of surgery includes risk of infection 1 in 200 patients, bleeding 1/2% chance you would need a transfusion. Risk to the nerves is one in 10,000. You will need to use a brace for 3 months and wean from the brace on the 4th month. Expect improved walking and standing  tolerance. Expect relief of leg pain but numbness may persist depending on the length and degree of pressure that has been present. Unfortunately her Hgb A1 c remains greater than 8. This is not controlled and increases the risk of surgical treatment.  She also has had recent dental surgery and hopefully this will be stable before we schedule intervention for her lumbar spine.    Follow-Up Instructions: No follow-ups on file.   Orders:  No orders of the defined types were placed in this encounter.  No orders of the defined types were placed in this encounter.     Procedures: No procedures performed   Clinical Data: No additional findings.   Subjective: Chief Complaint  Patient presents with  . Lower Back - Follow-up    MRI Review    65 year old female with spondylolisthesis and degenerative disc disease with 3 levels of spondylolisthesis and DDD. She has diabetes And this is improving. She is now running in the 150-200 area of blood sugar. She has not been able to see her primary care physician due to recent spike in COVID and Dr. Concepcion Elk is planning to hold a office appointment by internet.  She is still hurting into the back and into the  Lower back and buttocks and thighs. She has pain with standing and walking. She is functioning some better. Around the Dec 20 she stopped  the gabapentin and she is sleeping better. She is able to grocery shop and she pushed the cart and managed to get the shopping and groceries done. No bowel or bladder difficulty, except for constipation. She is not taking oxycodone. Has stopped the gabapentin but takes the tizanidine intermittantly but this does make her drowsy. Has not tried CBD or other over the counter r remedies.   Review of Systems  Constitutional: Negative.   HENT: Negative.   Eyes: Negative.   Respiratory: Negative.   Cardiovascular: Negative.   Gastrointestinal: Negative.   Endocrine: Negative.   Genitourinary: Negative.    Musculoskeletal: Negative.   Skin: Negative.   Allergic/Immunologic: Negative.   Neurological: Negative.   Hematological: Negative.   Psychiatric/Behavioral: Negative.      Objective: Vital Signs: BP (!) 143/79 (BP Location: Left Arm, Patient Position: Sitting)   Pulse (!) 102   Ht 5\' 2"  (1.575 m)   Wt 215 lb 8 oz (97.8 kg)   BMI 39.42 kg/m   Physical Exam Constitutional:      Appearance: She is well-developed and well-nourished.  HENT:     Head: Normocephalic and atraumatic.  Eyes:     Extraocular Movements: EOM normal.     Pupils: Pupils are equal, round, and reactive to light.  Pulmonary:     Effort: Pulmonary effort is normal.     Breath sounds: Normal breath sounds.  Abdominal:     General: Bowel sounds are normal.     Palpations: Abdomen is soft.  Musculoskeletal:     Cervical back: Normal range of motion and neck supple.     Lumbar back: Negative right straight leg raise test and negative left straight leg raise test.  Skin:    General: Skin is warm and dry.  Neurological:     Mental Status: She is alert and oriented to person, place, and time.  Psychiatric:        Mood and Affect: Mood and affect normal.        Behavior: Behavior normal.        Thought Content: Thought content normal.        Judgment: Judgment normal.     Back Exam   Tenderness  The patient is experiencing tenderness in the lumbar.  Range of Motion  Extension: abnormal  Flexion: normal  Lateral bend right: normal  Lateral bend left: normal  Rotation right: normal  Rotation left: normal   Muscle Strength  Right Quadriceps:  5/5  Left Quadriceps:  5/5  Right Hamstrings:  5/5  Left Hamstrings:  5/5   Tests  Straight leg raise right: negative Straight leg raise left: negative  Reflexes  Patellar: 2/4 Achilles: 2/4 Babinski's sign: normal   Other  Toe walk: normal Heel walk: normal Sensation: decreased Erythema: no back redness Scars: absent      Specialty  Comments:  No specialty comments available.  Imaging: No results found.   PMFS History: Patient Active Problem List   Diagnosis Date Noted  . HYPERTENSION 09/02/2008  . DYSPNEA 09/02/2008  . DIABETES, TYPE 2 09/01/2008   Past Medical History:  Diagnosis Date  . Diabetes mellitus without complication (HCC)   . Hypertension     History reviewed. No pertinent family history.  Past Surgical History:  Procedure Laterality Date  . ABDOMINAL HYSTERECTOMY    . FOOT SURGERY    . TOOTH EXTRACTION N/A 08/15/2020   Procedure: DENTAL RESTORATION/EXTRACTIONS;  Surgeon: 10/15/2020, DDS;  Location: [facility];  Service:  Oral Surgery;  Laterality: N/A;   Social History   Occupational History  . Occupation: housekeeping  . Occupation: Surveyor, mining  Tobacco Use  . Smoking status: Former Smoker    Types: Cigarettes, E-cigarettes  . Smokeless tobacco: Current User  Vaping Use  . Vaping Use: Former  Substance and Sexual Activity  . Alcohol use: No  . Drug use: Never  . Sexual activity: Not on file

## 2020-12-01 NOTE — Patient Instructions (Signed)
Plan:Avoid bending, stooping and avoid lifting weights greater than 10 lbs. Avoid prolong standing and walking. Order for a new walker with wheels. We are going to wait as your pain and dysfunction due to your back is not as bad. Work on improving your sugars helps Decease risk to the nerves, make them less sensitive to surgical manipulation and also decreases the risk of infection and complications Due to intervention. If you are improving then we can wait till you are ready to consider surgery and also when the hospitals are less Under stress due to the recent spike in COVID and you are less likely to acquire the virus by being in the hospital.    Surgery recommended is adecompression L2-3, L3-4 and L4-5 withthreelevel lumbar fusion L2-3,L3-4and L4-5 this would be done with rods, screws and cages with local bone graft and allograft (donor bone graft). Take hydrocodone for for pain. Risk of surgery includes risk of infection 1 in 200 patients, bleeding 1/2% chance you would need a transfusion. Risk to the nerves is one in 10,000. You will need to use a brace for 3 months and wean from the brace on the 4th month. Expect improved walking and standing tolerance. Expect relief of leg pain but numbness may persist depending on the length and degree of pressure that has been present. Unfortunately her Hgb A1 c remains greater than 8. This is not controlled and increases the risk of surgical treatment.  She also has had recent dental surgery and hopefully this will be stable before we schedule intervention for her lumbar spine.

## 2020-12-28 ENCOUNTER — Other Ambulatory Visit: Payer: Self-pay | Admitting: Internal Medicine

## 2020-12-29 LAB — COMPLETE METABOLIC PANEL WITH GFR
AG Ratio: 1.5 (calc) (ref 1.0–2.5)
ALT: 33 U/L — ABNORMAL HIGH (ref 6–29)
AST: 29 U/L (ref 10–35)
Albumin: 4.3 g/dL (ref 3.6–5.1)
Alkaline phosphatase (APISO): 79 U/L (ref 37–153)
BUN/Creatinine Ratio: 14 (calc) (ref 6–22)
BUN: 17 mg/dL (ref 7–25)
CO2: 25 mmol/L (ref 20–32)
Calcium: 9.4 mg/dL (ref 8.6–10.4)
Chloride: 102 mmol/L (ref 98–110)
Creat: 1.18 mg/dL — ABNORMAL HIGH (ref 0.50–0.99)
GFR, Est African American: 56 mL/min/{1.73_m2} — ABNORMAL LOW (ref >=60)
GFR, Est Non African American: 48 mL/min/{1.73_m2} — ABNORMAL LOW (ref >=60)
Globulin: 2.8 g/dL (calc) (ref 1.9–3.7)
Glucose, Bld: 110 mg/dL — ABNORMAL HIGH (ref 65–99)
Potassium: 4 mmol/L (ref 3.5–5.3)
Sodium: 140 mmol/L (ref 135–146)
Total Bilirubin: 1.4 mg/dL — ABNORMAL HIGH (ref 0.2–1.2)
Total Protein: 7.1 g/dL (ref 6.1–8.1)

## 2020-12-29 LAB — CBC
HCT: 41.8 % (ref 35.0–45.0)
Hemoglobin: 14.2 g/dL (ref 11.7–15.5)
MCH: 32.6 pg (ref 27.0–33.0)
MCHC: 34 g/dL (ref 32.0–36.0)
MCV: 96.1 fL (ref 80.0–100.0)
MPV: 11.5 fL (ref 7.5–12.5)
Platelets: 161 10*3/uL (ref 140–400)
RBC: 4.35 10*6/uL (ref 3.80–5.10)
RDW: 12.5 % (ref 11.0–15.0)
WBC: 7.1 10*3/uL (ref 3.8–10.8)

## 2020-12-29 LAB — LIPID PANEL
Cholesterol: 147 mg/dL (ref <200)
HDL: 35 mg/dL — ABNORMAL LOW (ref >=50)
LDL Cholesterol (Calc): 86 mg/dL (calc)
Non-HDL Cholesterol (Calc): 112 mg/dL (calc) (ref <130)
Total CHOL/HDL Ratio: 4.2 (calc) (ref <5.0)
Triglycerides: 166 mg/dL — ABNORMAL HIGH (ref <150)

## 2020-12-29 LAB — TSH: TSH: 2.2 mIU/L (ref 0.40–4.50)

## 2020-12-29 LAB — VITAMIN D 25 HYDROXY (VIT D DEFICIENCY, FRACTURES): Vit D, 25-Hydroxy: 30 ng/mL (ref 30–100)

## 2021-03-01 ENCOUNTER — Other Ambulatory Visit: Payer: Self-pay

## 2021-03-01 ENCOUNTER — Encounter: Payer: Self-pay | Admitting: Specialist

## 2021-03-01 ENCOUNTER — Ambulatory Visit (INDEPENDENT_AMBULATORY_CARE_PROVIDER_SITE_OTHER): Payer: Medicare Other | Admitting: Specialist

## 2021-03-01 VITALS — BP 136/85 | HR 76 | Ht 62.0 in | Wt 215.0 lb

## 2021-03-01 DIAGNOSIS — M4807 Spinal stenosis, lumbosacral region: Secondary | ICD-10-CM | POA: Diagnosis not present

## 2021-03-01 DIAGNOSIS — M4316 Spondylolisthesis, lumbar region: Secondary | ICD-10-CM | POA: Diagnosis not present

## 2021-03-01 DIAGNOSIS — M5416 Radiculopathy, lumbar region: Secondary | ICD-10-CM | POA: Diagnosis not present

## 2021-03-01 DIAGNOSIS — M48062 Spinal stenosis, lumbar region with neurogenic claudication: Secondary | ICD-10-CM

## 2021-03-01 DIAGNOSIS — M545 Low back pain, unspecified: Secondary | ICD-10-CM

## 2021-03-01 DIAGNOSIS — M25551 Pain in right hip: Secondary | ICD-10-CM

## 2021-03-01 DIAGNOSIS — G8929 Other chronic pain: Secondary | ICD-10-CM

## 2021-03-01 NOTE — Progress Notes (Signed)
Office Visit Note   Patient: Amanda Hatfield.           Date of Birth: 1956-05-14           MRN: [id number] Visit Date: 03/01/2021              Requested by: Fleet Contras, MD [address] [place],  [place] [address] PCP: Fleet Contras, MD   Assessment & Plan: Visit Diagnoses:  1. Spondylolisthesis, lumbar region   2. Spinal stenosis of lumbar region with neurogenic claudication   3. Radiculopathy, lumbar region   4. Spondylolisthesis of lumbar region   5. Spinal stenosis of lumbosacral region   6. Chronic low back pain, unspecified back pain laterality, unspecified whether sciatica present   7. Pain in right hip     Plan: Avoid bending, stooping and avoid lifting weights greater than 10 lbs. Avoid prolong standing and walking. Avoid frequent bending and stooping  No lifting greater than 10 lbs. May use ice or moist heat for pain. Weight loss is of benefit. Handicap license is approved. I recommend that you consider having a lumbar decompression for the spinal narrowing and fusion of the lumbar levels that are shifting.  Fusion prevents the shifting from worsening and causing recurrent narrowing of the spinal canal.  Follow-Up Instructions: Return in about 3 months (around 05/31/2021).   Orders:  No orders of the defined types were placed in this encounter.  No orders of the defined types were placed in this encounter.     Procedures: No procedures performed   Clinical Data: No additional findings.   Subjective: Chief Complaint  Patient presents with  . Lower Back - Pain    65 year old female with history of back and buttock and thigh pain with intermittant pain into the legs. She is limited in standing and walking, she is able to grocery shop but tends to stoop and lean on the grocery  Carts. She is better with sitting but sitting too long with make the pain worse. She has undergone radiographs and these show DDD and spondylolisthesis L2-3, L3-4 and  L4-5. There is a list of the spine to the right. No bowel or bladder difficulty is taking medication for her bladder and this will help with voiding as often at night  Time.    Review of Systems  Constitutional: Negative.   HENT: Negative.   Eyes: Negative.   Respiratory: Negative.   Cardiovascular: Negative.   Gastrointestinal: Negative.   Endocrine: Negative.   Genitourinary: Negative.   Musculoskeletal: Negative.   Skin: Negative.   Allergic/Immunologic: Negative.   Neurological: Negative.   Hematological: Negative.   Psychiatric/Behavioral: Negative.      Objective: Vital Signs: BP 136/85   Pulse 76   Ht 5\' 2"  (1.575 m)   Wt 215 lb (97.5 kg)   BMI 39.32 kg/m   Physical Exam Constitutional:      Appearance: She is well-developed.  HENT:     Head: Normocephalic and atraumatic.  Eyes:     Pupils: Pupils are equal, round, and reactive to light.  Pulmonary:     Effort: Pulmonary effort is normal.     Breath sounds: Normal breath sounds.  Abdominal:     General: Bowel sounds are normal.     Palpations: Abdomen is soft.  Musculoskeletal:     Cervical back: Normal range of motion and neck supple.     Lumbar back: Positive right straight leg raise test and positive left straight leg  raise test.  Skin:    General: Skin is warm and dry.  Neurological:     Mental Status: She is alert and oriented to person, place, and time.  Psychiatric:        Behavior: Behavior normal.        Thought Content: Thought content normal.        Judgment: Judgment normal.     Back Exam   Tenderness  The patient is experiencing tenderness in the lumbar.  Range of Motion  Extension: abnormal  Lateral bend right: abnormal  Lateral bend left: abnormal  Rotation right: abnormal  Rotation left: abnormal   Muscle Strength  Right Quadriceps:  5/5  Left Quadriceps:  5/5  Right Hamstrings:  5/5  Left Hamstrings:  5/5   Tests  Straight leg raise right: positive Straight leg raise  left: positive  Reflexes  Patellar: 0/4 Achilles: 0/4 Babinski's sign: normal   Other  Toe walk: abnormal Heel walk: abnormal Sensation: decreased Gait: normal  Erythema: no back redness Scars: absent  Comments:  Occasionally      Specialty Comments:  No specialty comments available.  Imaging: No results found.   PMFS History: Patient Active Problem List   Diagnosis Date Noted  . HYPERTENSION 09/02/2008  . DYSPNEA 09/02/2008  . DIABETES, TYPE 2 09/01/2008   Past Medical History:  Diagnosis Date  . Diabetes mellitus without complication (HCC)   . Hypertension     History reviewed. No pertinent family history.  Past Surgical History:  Procedure Laterality Date  . ABDOMINAL HYSTERECTOMY    . FOOT SURGERY    . TOOTH EXTRACTION N/A 08/15/2020   Procedure: DENTAL RESTORATION/EXTRACTIONS;  Surgeon: Ocie Doyne, DDS;  Location: [facility];  Service: Oral Surgery;  Laterality: N/A;   Social History   Occupational History  . Occupation: housekeeping  . Occupation: Surveyor, mining  Tobacco Use  . Smoking status: Former Smoker    Types: Cigarettes, E-cigarettes  . Smokeless tobacco: Current User  Vaping Use  . Vaping Use: Former  Substance and Sexual Activity  . Alcohol use: No  . Drug use: Never  . Sexual activity: Not on file

## 2021-03-01 NOTE — Patient Instructions (Signed)
Avoid bending, stooping and avoid lifting weights greater than 10 lbs. Avoid prolong standing and walking. Avoid frequent bending and stooping  No lifting greater than 10 lbs. May use ice or moist heat for pain. Weight loss is of benefit. Handicap license is approved. I recommend that you consider having a lumbar decompression for the spinal narrowing and fusion of the lumbar levels that are shifting.  Fusion prevents the shifting from worsening and causing recurrent narrowing of the spinal canal.

## 2021-03-07 IMAGING — MR MR LUMBAR SPINE W/O CM
4 of 5 series · 17 of 48 positions shown · non-contrast
Comparison: Lumbar spine MRI 02/14/2020.  Radiographs 01/27/2020

CLINICAL DATA: Progressive neurologic deficit. Spinal stenosis with
back pain. No acute injury or prior relevant surgery.

EXAM:
MRI LUMBAR SPINE WITHOUT CONTRAST
TECHNIQUE: Multiplanar, multisequence MR imaging of the lumbar spine was
performed. No intravenous contrast was administered.

[Series 6: T2 · sagittal · 4.0mm · 0.73mm/px · 6 of 15 slices shown (1 of 2)]
[im 1/15]
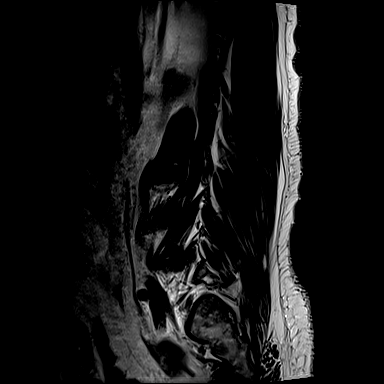
[im 3/15]
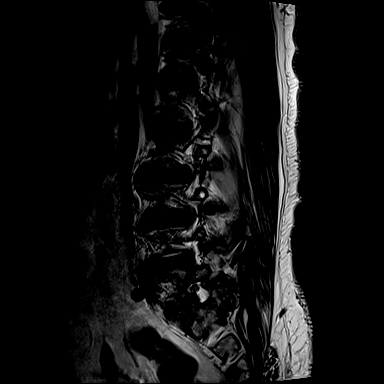
[im 6/15]
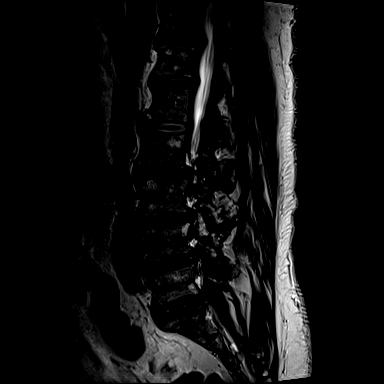
[im 9/15]
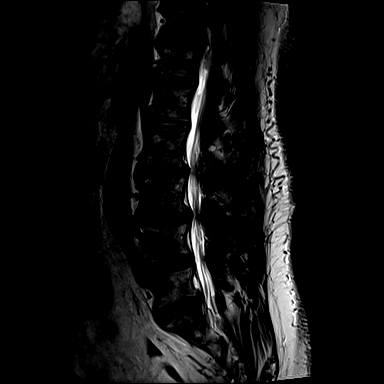
[im 12/15]
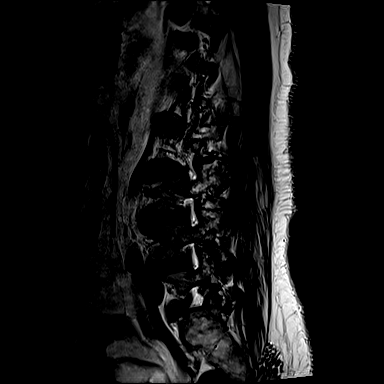
[im 15/15]
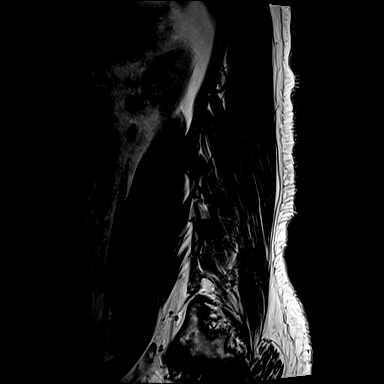

[Series 7: T1 · sagittal · 4.0mm · 0.73mm/px · 3 of 15 slices shown (1 of 2)]
[im 3/15]
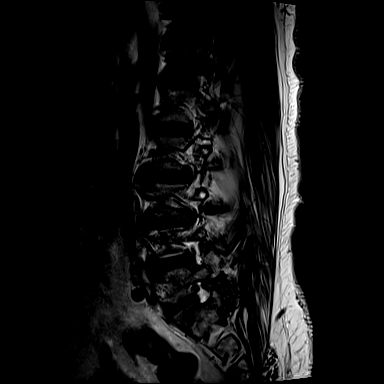
[im 9/15]
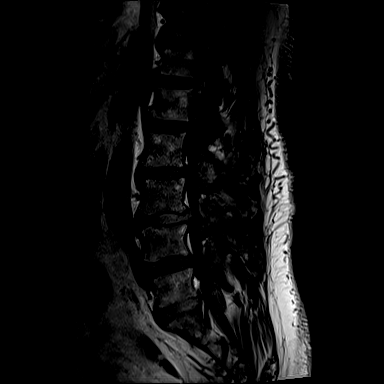
[im 15/15]
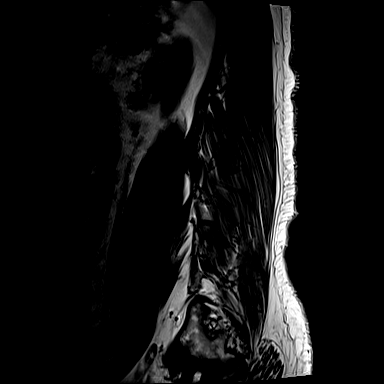

[Series 13: T2 · axial · 4.0mm · 0.28mm/px · z∈[-109,+59]mm · 5 of 39 slices shown (2 of 2)]
[im 1/39]
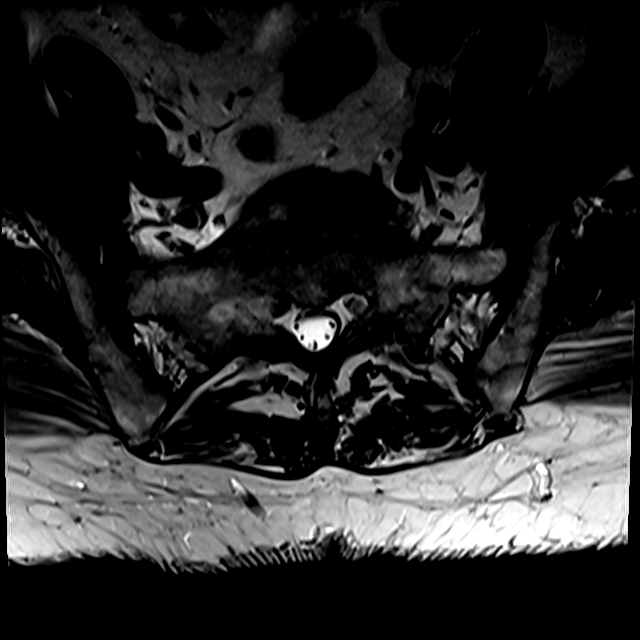
[im 6/39]
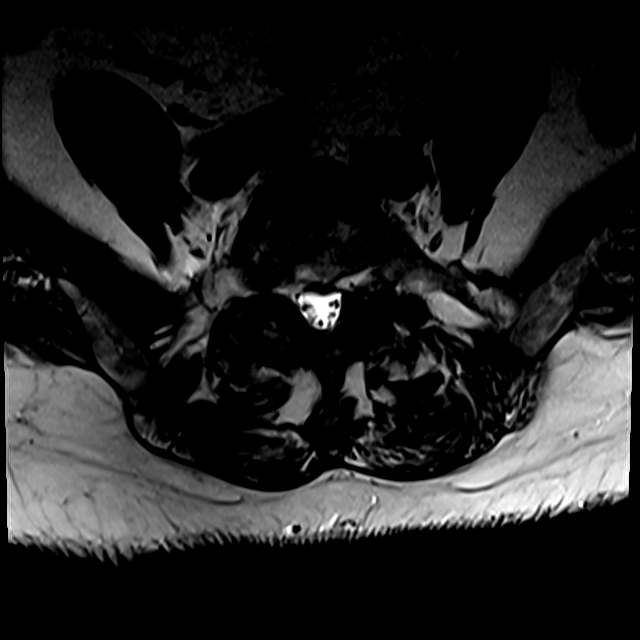
[im 11/39]
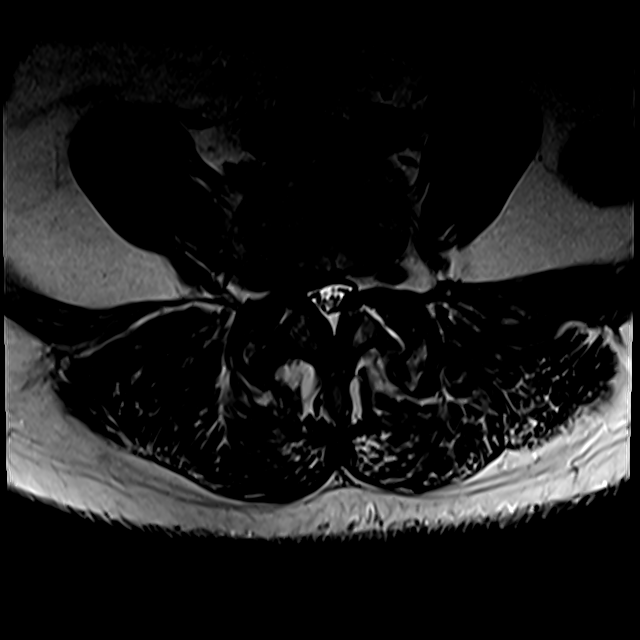
[im 20/39]
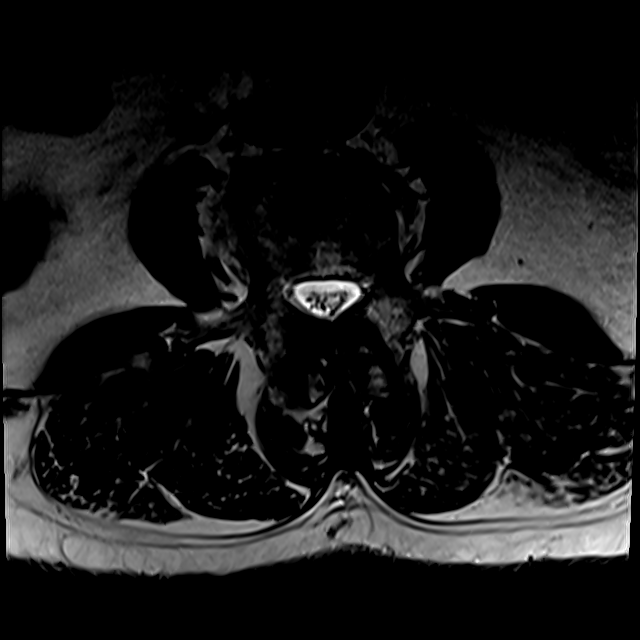
[im 33/39]
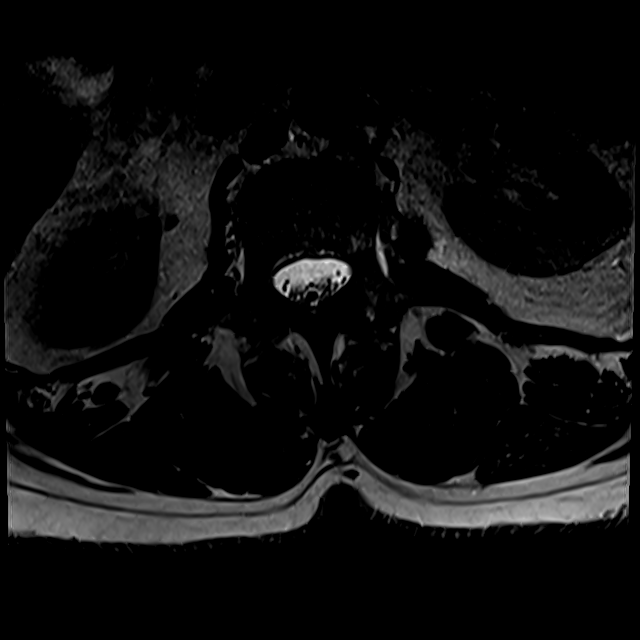

[Series 100: T1 · axial · 4.0mm · 0.28mm/px · z∈[-84,+59]mm · 3 of 39 slices shown (2 of 2)]
[im 6/39]
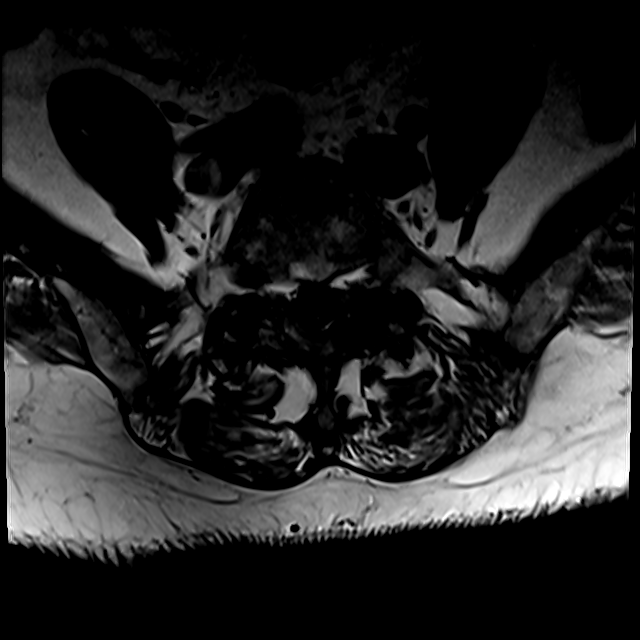
[im 20/39]
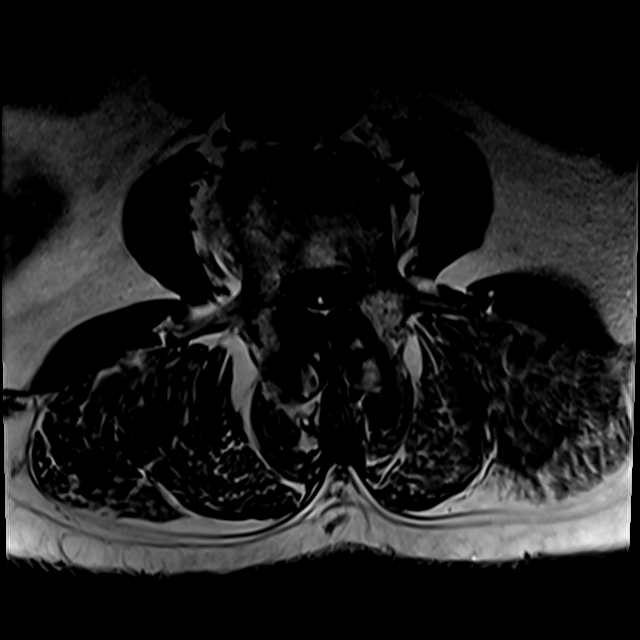
[im 33/39]
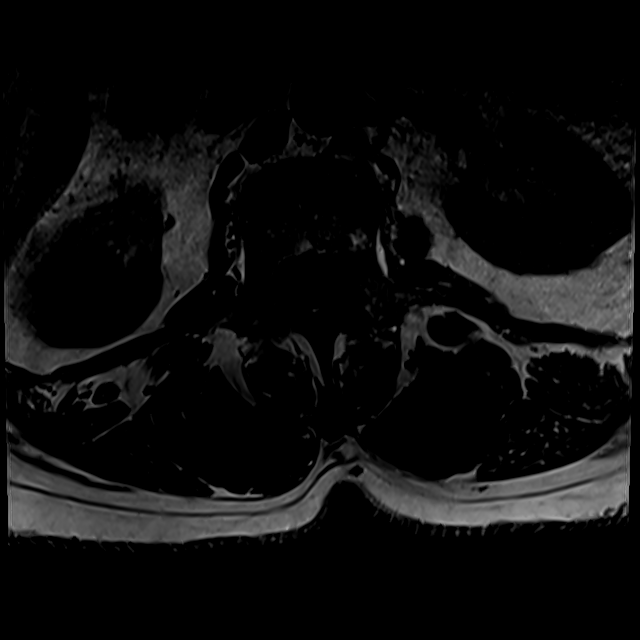

[17 of 48 positions shown; findings below may reference images not displayed]

FINDINGS: Segmentation: Conventional anatomy assumed, with the last open disc
space designated L5-S1.Concordant with previous imaging.

Alignment: Grade 1 anterolisthesis at L2-3 and L3-4, similar to
previous study.

Vertebrae: No worrisome osseous lesion, acute fracture or pars
defect. There is prominent multilevel facet arthropathy with
associated subchondral edema. The lumbar pedicles are short on a
congenital basis. There are mild sacroiliac degenerative changes
bilaterally.

Conus medullaris: Extends to the L1 level and appears normal.

Paraspinal and other soft tissues: No significant paraspinal
findings. Small right renal cysts are noted.

Disc levels:

Sagittal images demonstrate grossly stable disc bulging and a small
left paracentral disc protrusion at T11-12.

T12-L1: Stable mild disc bulging and shallow left paracentral disc
protrusion. No spinal stenosis or nerve root encroachment.

L1-2: The disc appears normal.  Stable mild facet hypertrophy.

L2-3: Loss of disc height with grade 1 anterolisthesis secondary to
advanced bilateral facet hypertrophy. There is resulting moderate to
severe spinal stenosis, similar to the previous study. There is
stable right-greater-than-left lateral recess narrowing as well as
mild right foraminal narrowing. Overall, this level does not appear
significantly changed.

L3-4: Chronic loss of disc height with annular disc bulging. Grade 1
anterolisthesis secondary to moderate bilateral facet and
ligamentous hypertrophy. Resulting moderate to severe multifactorial
spinal stenosis with asymmetric lateral recess and foraminal
narrowing on the right. Overall, this level does not appear
significantly changed.

L4-5: Disc height is relatively maintained. There is annular disc
bulging with a broad-based foraminal and extraforaminal disc
protrusion on the right. Moderate facet and ligamentous hypertrophy
contributing to moderate spinal stenosis and asymmetric lateral
recess and foraminal narrowing on the right. Overall, this level
does not appear significantly changed.

L5-S1: Preserved disc height with mild disc bulging. Stable moderate
facet and ligamentous hypertrophy contributing to mild lateral
recess and foraminal narrowing bilaterally. Overall, this level does
not appear significantly changed.
IMPRESSION: 1. No acute findings or significant changes from previous study of 9
months ago.
2. There is multilevel spondylosis with disc bulging, facet and
ligamentous hypertrophy as described. There is resulting moderate to
severe spinal stenosis at L2-3 and L3-4. Moderate spinal stenosis at
L4-5.
3. Broad-based foraminal and extraforaminal disc protrusion on the
right at L4-5 with resulting moderate lateral recess and foraminal
narrowing on the right.

## 2021-05-31 ENCOUNTER — Ambulatory Visit: Payer: Medicare Other | Admitting: Specialist

## 2021-07-11 ENCOUNTER — Ambulatory Visit: Payer: Medicare Other | Admitting: Specialist

## 2021-07-20 ENCOUNTER — Other Ambulatory Visit: Payer: Self-pay | Admitting: Internal Medicine

## 2021-07-20 DIAGNOSIS — Z1231 Encounter for screening mammogram for malignant neoplasm of breast: Secondary | ICD-10-CM

## 2021-07-26 ENCOUNTER — Ambulatory Visit: Payer: Medicare Other | Admitting: Specialist

## 2021-08-27 ENCOUNTER — Ambulatory Visit: Payer: Medicare Other

## 2021-08-31 ENCOUNTER — Ambulatory Visit: Payer: Medicare Other

## 2021-09-05 ENCOUNTER — Ambulatory Visit: Payer: Medicare Other | Admitting: Specialist

## 2021-10-04 ENCOUNTER — Ambulatory Visit: Payer: Medicare Other | Admitting: Specialist

## 2021-10-22 ENCOUNTER — Ambulatory Visit
Admission: RE | Admit: 2021-10-22 | Discharge: 2021-10-22 | Disposition: A | Discharge status: Home / Self Care | Payer: Medicare Other | Source: Ambulatory Visit | Attending: Internal Medicine | Admitting: Internal Medicine

## 2021-10-22 DIAGNOSIS — Z1231 Encounter for screening mammogram for malignant neoplasm of breast: Secondary | ICD-10-CM

## 2021-12-12 ENCOUNTER — Other Ambulatory Visit: Payer: Self-pay | Admitting: Internal Medicine

## 2021-12-13 LAB — LIPID PANEL
Cholesterol: 146 mg/dL (ref <200)
HDL: 38 mg/dL — ABNORMAL LOW (ref >=50)
LDL Cholesterol (Calc): 88 mg/dL (calc)
Non-HDL Cholesterol (Calc): 108 mg/dL (calc) (ref <130)
Total CHOL/HDL Ratio: 3.8 (calc) (ref <5.0)
Triglycerides: 106 mg/dL (ref <150)

## 2021-12-13 LAB — COMPLETE METABOLIC PANEL WITH GFR
AG Ratio: 1.6 (calc) (ref 1.0–2.5)
ALT: 25 U/L (ref 6–29)
AST: 23 U/L (ref 10–35)
Albumin: 4.4 g/dL (ref 3.6–5.1)
Alkaline phosphatase (APISO): 87 U/L (ref 37–153)
BUN/Creatinine Ratio: 14 (calc) (ref 6–22)
BUN: 17 mg/dL (ref 7–25)
CO2: 24 mmol/L (ref 20–32)
Calcium: 9.6 mg/dL (ref 8.6–10.4)
Chloride: 105 mmol/L (ref 98–110)
Creat: 1.23 mg/dL — ABNORMAL HIGH (ref 0.50–1.05)
Globulin: 2.8 g/dL (calc) (ref 1.9–3.7)
Glucose, Bld: 129 mg/dL — ABNORMAL HIGH (ref 65–99)
Potassium: 4.2 mmol/L (ref 3.5–5.3)
Sodium: 140 mmol/L (ref 135–146)
Total Bilirubin: 0.9 mg/dL (ref 0.2–1.2)
Total Protein: 7.2 g/dL (ref 6.1–8.1)
eGFR: 48 mL/min/{1.73_m2} — ABNORMAL LOW (ref >=60)

## 2021-12-13 LAB — VITAMIN B12: Vitamin B-12: 349 pg/mL (ref 200–1100)

## 2021-12-13 LAB — CBC
HCT: 41.1 % (ref 35.0–45.0)
Hemoglobin: 13.8 g/dL (ref 11.7–15.5)
MCH: 32.6 pg (ref 27.0–33.0)
MCHC: 33.6 g/dL (ref 32.0–36.0)
MCV: 97.2 fL (ref 80.0–100.0)
MPV: 11.6 fL (ref 7.5–12.5)
Platelets: 166 10*3/uL (ref 140–400)
RBC: 4.23 10*6/uL (ref 3.80–5.10)
RDW: 12.7 % (ref 11.0–15.0)
WBC: 7.7 10*3/uL (ref 3.8–10.8)

## 2021-12-13 LAB — TSH: TSH: 2.39 mIU/L (ref 0.40–4.50)

## 2021-12-13 LAB — VITAMIN D 25 HYDROXY (VIT D DEFICIENCY, FRACTURES): Vit D, 25-Hydroxy: 30 ng/mL (ref 30–100)

## 2021-12-13 LAB — FOLATE: Folate: 17.4 ng/mL

## 2021-12-14 ENCOUNTER — Other Ambulatory Visit: Payer: Self-pay | Admitting: Internal Medicine

## 2021-12-14 DIAGNOSIS — Z1231 Encounter for screening mammogram for malignant neoplasm of breast: Secondary | ICD-10-CM

## 2021-12-18 ENCOUNTER — Ambulatory Visit: Payer: Medicare Other

## 2022-01-01 ENCOUNTER — Ambulatory Visit
Admission: RE | Admit: 2022-01-01 | Discharge: 2022-01-01 | Disposition: A | Discharge status: Home / Self Care | Payer: Medicare Other | Source: Ambulatory Visit | Attending: Internal Medicine | Admitting: Internal Medicine

## 2022-01-01 DIAGNOSIS — Z1231 Encounter for screening mammogram for malignant neoplasm of breast: Secondary | ICD-10-CM

## 2022-04-22 ENCOUNTER — Ambulatory Visit (INDEPENDENT_AMBULATORY_CARE_PROVIDER_SITE_OTHER): Payer: Medicare Other | Admitting: Specialist

## 2022-04-22 ENCOUNTER — Ambulatory Visit: Payer: Self-pay

## 2022-04-22 ENCOUNTER — Ambulatory Visit (INDEPENDENT_AMBULATORY_CARE_PROVIDER_SITE_OTHER): Payer: Medicare Other

## 2022-04-22 ENCOUNTER — Encounter: Payer: Self-pay | Admitting: Specialist

## 2022-04-22 VITALS — BP 145/84 | HR 118 | Ht 62.0 in | Wt 215.0 lb

## 2022-04-22 DIAGNOSIS — M5416 Radiculopathy, lumbar region: Secondary | ICD-10-CM | POA: Diagnosis not present

## 2022-04-22 DIAGNOSIS — G8929 Other chronic pain: Secondary | ICD-10-CM

## 2022-04-22 DIAGNOSIS — M542 Cervicalgia: Secondary | ICD-10-CM | POA: Diagnosis not present

## 2022-04-22 DIAGNOSIS — M79642 Pain in left hand: Secondary | ICD-10-CM

## 2022-04-22 DIAGNOSIS — M4316 Spondylolisthesis, lumbar region: Secondary | ICD-10-CM

## 2022-04-22 DIAGNOSIS — M25649 Stiffness of unspecified hand, not elsewhere classified: Secondary | ICD-10-CM

## 2022-04-22 DIAGNOSIS — M79641 Pain in right hand: Secondary | ICD-10-CM

## 2022-04-22 DIAGNOSIS — M545 Low back pain, unspecified: Secondary | ICD-10-CM

## 2022-04-22 DIAGNOSIS — M4802 Spinal stenosis, cervical region: Secondary | ICD-10-CM

## 2022-04-22 DIAGNOSIS — M48062 Spinal stenosis, lumbar region with neurogenic claudication: Secondary | ICD-10-CM

## 2022-04-22 MED ORDER — TRAMADOL-ACETAMINOPHEN 37.5-325 MG PO TABS: 1.0000 | ORAL_TABLET | Freq: Four times a day (QID) | ORAL | 0 refills | Status: DC | PRN

## 2022-04-22 NOTE — Patient Instructions (Signed)
Plan: Avoid overhead lifting and overhead use of the arms. °Do not lift greater than 5 lbs. °Adjust head rest in vehicle to prevent hyperextension if rear ended. °Take extra precautions to avoid fallingAvoid bending, stooping and avoid lifting weights greater than 10 lbs. °Avoid prolong standing and walking. °Avoid frequent bending and stooping  °No lifting greater than 10 lbs. °May use ice or moist heat for pain. °Weight loss is of benefit. °Handicap license is approved. °

## 2022-04-22 NOTE — Progress Notes (Signed)
Office Visit Note   Patient: Amanda Hatfield.           Date of Birth: [birth year]           MRN: [id number] Visit Date: 04/22/2022              Requested by: Fleet Contras, MD [address] [place],  [place] [address] PCP: Fleet Contras, MD   Assessment & Plan: Visit Diagnoses:  1. Spondylolisthesis, lumbar region   2. Spinal stenosis of lumbar region with neurogenic claudication   3. Radiculopathy, lumbar region   4. Cervicalgia   5. Chronic low back pain, unspecified back pain laterality, unspecified whether sciatica present   6. Spondylolisthesis of lumbar region   7. Bilateral hand pain   8. Morning joint stiffness of hand, unspecified laterality     Plan: Avoid overhead lifting and overhead use of the arms. Do not lift greater than 5 lbs. Adjust head rest in vehicle to prevent hyperextension if rear ended. Take extra precautions to avoid falling. Avoid bending, stooping and avoid lifting weights greater than 10 lbs. Avoid prolong standing and walking. Avoid frequent bending and stooping  No lifting greater than 10 lbs. May use ice or moist heat for pain. Weight loss is of benefit. Handicap license is approved.  Follow-Up Instructions: Return in about 6 weeks (around 06/03/2022).   Orders:  Orders Placed This Encounter  Procedures   XR Lumbar Spine 2-3 Views   XR Cervical Spine 2 or 3 views   XR Hand Complete Right   XR Hand Complete Left   No orders of the defined types were placed in this encounter.     Procedures: No procedures performed   Clinical Data: No additional findings.   Subjective: Chief Complaint  Patient presents with   Lower Back - Follow-up    66 year old female with history of back pain and pain into the hands. She relates that the pain is becoming worse and she is having difficulty with sleep. No bowel or bladder difficulty.    Review of Systems   Objective: Vital Signs: BP (!) 145/84 (BP Location: Left Arm, Patient  Position: Sitting)   Pulse (!) 118   Ht 5\' 2"  (1.575 m)   Wt 215 lb (97.5 kg)   BMI 39.32 kg/m   Physical Exam  Ortho Exam  Specialty Comments:  No specialty comments available.  Imaging: XR Cervical Spine 2 or 3 views  Result Date: 04/22/2022 Straightening of the cervical lordotic curve over the lower half  of the cervical spine, disc height narrowing C4-5, C5-6 and C6-7. Diffuse spondylosis C4 to C7 with posterior spurs than narrow the space available for the cord and are potentially related to cervical stenosis. Minimal anterolisthesT1.   XR Lumbar Spine 2-3 Views  Result Date: 04/22/2022 AP and lateral flexion and extension radiographs show spondylolisthesis L2-3, L3-4 and L4-5, grade one at each of levels. Right lumbar scoliosis of 10 degrees mainly associated with the disc degeneration at L3-L5.   XR Hand Complete Left  Result Date: 04/22/2022 Radiographs of the hand show narrowing of the DIP joints throughout the hand and mild PIP joint line narrowing. Mild narrowing of the thumb MC-C joint line and trapezial scaphoid joint.  XR Hand Complete Right  Result Date: 04/22/2022 Radiographs of the hand show narrowing of the DIP joints throughout the hand and mild PIP joint line narrowing. Mild narrowing of the thumb MC-C joint line and trapezial scaphoid joint.    PMFS  History: Patient Active Problem List   Diagnosis Date Noted   HYPERTENSION 09/02/2008   DYSPNEA 09/02/2008   DIABETES, TYPE 2 09/01/2008   Past Medical History:  Diagnosis Date   Diabetes mellitus without complication (HCC)    Hypertension     Family History  Problem Relation Age of Onset   Breast cancer Neg Hx     Past Surgical History:  Procedure Laterality Date   ABDOMINAL HYSTERECTOMY     BREAST BIOPSY Right 08/2020   FOOT SURGERY     TOOTH EXTRACTION N/A 08/15/2020   Procedure: DENTAL RESTORATION/EXTRACTIONS;  Surgeon: Ocie Doyne, DDS;  Location: [facility];  Service: Oral  Surgery;  Laterality: N/A;   Social History   Occupational History   Occupation: housekeeping   Occupation: Surveyor, mining  Tobacco Use   Smoking status: Former    Types: Cigarettes, E-cigarettes   Smokeless tobacco: Current  Vaping Use   Vaping Use: Former  Substance and Sexual Activity   Alcohol use: No   Drug use: Never   Sexual activity: Not on file

## 2022-05-02 ENCOUNTER — Encounter: Payer: Self-pay | Admitting: *Deleted

## 2022-05-19 ENCOUNTER — Ambulatory Visit
Admission: RE | Admit: 2022-05-19 | Discharge: 2022-05-19 | Disposition: A | Discharge status: Home / Self Care | Payer: Medicare Other | Source: Ambulatory Visit | Attending: Specialist | Admitting: Specialist

## 2022-05-19 DIAGNOSIS — M79641 Pain in right hand: Secondary | ICD-10-CM

## 2022-05-19 DIAGNOSIS — M48062 Spinal stenosis, lumbar region with neurogenic claudication: Secondary | ICD-10-CM

## 2022-05-19 DIAGNOSIS — M5416 Radiculopathy, lumbar region: Secondary | ICD-10-CM

## 2022-05-19 DIAGNOSIS — M4316 Spondylolisthesis, lumbar region: Secondary | ICD-10-CM

## 2022-05-19 DIAGNOSIS — M542 Cervicalgia: Secondary | ICD-10-CM

## 2022-05-19 DIAGNOSIS — M4802 Spinal stenosis, cervical region: Secondary | ICD-10-CM

## 2022-05-30 ENCOUNTER — Encounter: Payer: Self-pay | Admitting: Specialist

## 2022-05-30 ENCOUNTER — Ambulatory Visit (INDEPENDENT_AMBULATORY_CARE_PROVIDER_SITE_OTHER): Payer: Medicare Other | Admitting: Specialist

## 2022-05-30 VITALS — BP 125/75 | HR 106 | Ht 62.0 in | Wt 210.0 lb

## 2022-05-30 DIAGNOSIS — M542 Cervicalgia: Secondary | ICD-10-CM

## 2022-05-30 DIAGNOSIS — M5416 Radiculopathy, lumbar region: Secondary | ICD-10-CM

## 2022-05-30 DIAGNOSIS — M4316 Spondylolisthesis, lumbar region: Secondary | ICD-10-CM | POA: Diagnosis not present

## 2022-05-30 DIAGNOSIS — M48062 Spinal stenosis, lumbar region with neurogenic claudication: Secondary | ICD-10-CM | POA: Diagnosis not present

## 2022-05-30 DIAGNOSIS — M4807 Spinal stenosis, lumbosacral region: Secondary | ICD-10-CM

## 2022-05-30 DIAGNOSIS — M4012 Other secondary kyphosis, cervical region: Secondary | ICD-10-CM

## 2022-05-30 DIAGNOSIS — M4802 Spinal stenosis, cervical region: Secondary | ICD-10-CM

## 2022-05-30 MED ORDER — OXYCODONE-ACETAMINOPHEN 5-325 MG PO TABS: 1.0000 | ORAL_TABLET | ORAL | 0 refills | Status: DC | PRN

## 2022-05-30 NOTE — Patient Instructions (Signed)
Avoid overhead lifting and overhead use of the arms. Do not lift greater than 5 lbs. Adjust head rest in vehicle to prevent hyperextension if rear ended. Take extra precautions to avoid falling, including use of a cane if you feel weak. Scheduling secretary Tivis Ringer. will call you to arrange for surgery for your cervical spine. If you wish a second opinion please let us know and we can arrange for you. If you have worsening arm or leg numbness or weakness please call or go to an ER. We will contact your cardiologist and primary care physicians to seek clearance for your surgery. Surgery will be an anterior cervical discectomy and fusion at the C3-4, C4-5, C5-6 and C6-7 levels with decompression of the cervical spinal canal  and removal of bone pressing on the spinal cord. Fusion would then be done with bone grafting and plate and screws, Local bone graft as well and allograft bone graft and vivigen.  Risks of surgery include risks of infection, bleeding and risks to the spinal cord and  Risks of sore throat and difficulty swallowing which should  Improve over the next 4-6 weeks following surgery. Surgery is indicated due to upper extremity radiculopathy, lermittes phenomena and falls. In the future surgery at adjacent levels may be necessary but these levels do not appear to be related to your current symptoms or signs.

## 2022-05-30 NOTE — Progress Notes (Signed)
Office Visit Note   Patient: Amanda Hatfield.           Date of Birth: September 03, 1956           MRN: [id number] Visit Date: 05/30/2022              Requested by: Fleet Contras, MD [address] [place],  [place] [address] PCP: Fleet Contras, MD   Assessment & Plan: Visit Diagnoses:  1. Spondylolisthesis, lumbar region   2. Spinal stenosis of lumbar region with neurogenic claudication   3. Radiculopathy, lumbar region   4. Cervicalgia   5. Spondylolisthesis of lumbar region   6. Spinal stenosis of cervical region   7. Spinal stenosis of lumbosacral region   8. Other secondary kyphosis, cervical region     Plan: void overhead lifting and overhead use of the arms. Do not lift greater than 5 lbs. Adjust head rest in vehicle to prevent hyperextension if rear ended. Take extra precautions to avoid falling, including use of a cane if you feel weak. Scheduling secretary Tivis Ringer. will call you to arrange for surgery for your cervical spine. If you wish a second opinion please let us know and we can arrange for you. If you have worsening arm or leg numbness or weakness please call or go to an ER. We will contact your cardiologist and primary care physicians to seek clearance for your surgery. Surgery will be an anterior cervical discectomy and fusion at the C3-4, C4-5, C5-6 and C6-7 levels with decompression of the cervical spinal canal  and removal of bone pressing on the spinal cord. Fusion would then be done with bone grafting and plate and screws, Local bone graft as well and allograft bone graft and vivigen.  Risks of surgery include risks of infection, bleeding and risks to the spinal cord and  Risks of sore throat and difficulty swallowing which should  Improve over the next 4-6 weeks following surgery. Surgery is indicated due to upper extremity radiculopathy, lermittes phenomena and falls. In the future surgery at adjacent levels may be necessary but these levels do not  appear to be related to your current symptoms or signs.  Follow-Up Instructions: Return in about 4 weeks (around 06/27/2022).   Orders:  No orders of the defined types were placed in this encounter.  No orders of the defined types were placed in this encounter.     Procedures: No procedures performed   Clinical Data: Findings:  Narrative & Impression CLINICAL DATA:  Ataxia or coordination problem. Back pain and pain in the hands which is worsening.   EXAM: MRI CERVICAL SPINE WITHOUT CONTRAST   TECHNIQUE: Multiplanar, multisequence MR imaging of the cervical spine was performed. No intravenous contrast was administered.   COMPARISON:  Cervical spine CT from 2006   FINDINGS: Alignment: Reversal of cervical lordosis. Slight C3-4 and C4-5 anterolisthesis.   Vertebrae: Discogenic endplate edema at W4-1. Degenerative marrow edema at the right C7-T1 facet.   Cord: Normal signal and morphology.   Posterior Fossa, vertebral arteries, paraspinal tissues: Negative.   Disc levels:   C2-3: Degenerative facet spurring asymmetric to the right. Central disc protrusion. No neural impingement   C3-4: Degenerative facet spurring asymmetric to the right. Central disc protrusion and ligamentum flavum thickening causing mild cord flattening.   C4-5: Disc narrowing and bulging. Central herniation with upward migration to the mid C4 body. Separate small right paracentral herniation. Degenerative facet spurring asymmetric to the right. Disc material indents the ventral cord. At  least moderate left foraminal narrowing.   C5-6: Disc collapse with endplate ridging and bulging of the remaining disc. A central disc protrusion indents the ventral cord. Mild facet spurring. Moderate left foraminal narrowing   C6-7: Disc collapse with endplate and uncovertebral ridging. Negative facets. Advanced right and moderate left foraminal stenosis.   C7-T1:Facet osteoarthritis with spurring  asymmetric to the right. Mild anterolisthesis. The disc is narrowed and bulging with right uncovertebral ridging. Right foraminal impingement.   IMPRESSION: 1. Advanced and generalized cervical spine degeneration with mild C3-4 and C4-5 anterolisthesis. 2. Spinal stenosis with mild cord indentation at C3-4 to C5-6 3. Right foraminal impingement at C6-7 and C7-T1. 4. Moderate left foraminal stenosis at C4-5 to C6-7.     Electronically Signed   By: Tiburcio Pea M.D.   On: 05/20/2022 15:16    Narrative & Impression CLINICAL DATA:  Low back pain with symptoms persisting over 6 weeks of treatment. Lumbar radiculopathy   EXAM: MRI LUMBAR SPINE WITHOUT CONTRAST   TECHNIQUE: Multiplanar, multisequence MR imaging of the lumbar spine was performed. No intravenous contrast was administered.   COMPARISON:  11/17/2020   FINDINGS: Segmentation:  5 lumbar type vertebrae   Alignment:  Mild L2-3 anterolisthesis.   Vertebrae:  No fracture, evidence of discitis, or bone lesion.   Conus medullaris and cauda equina: Conus extends to the L1 level. Conus and cauda equina appear normal.   Paraspinal and other soft tissues: Negative for perispinal mass or inflammation. Simple right renal cystic intensity.   Disc levels:   T11-12: Left foraminal protrusion which could affect the left-sided nerve root, chronic.   T12- L1: Mild disc bulging.   L1-L2: Mild disc bulging and facet spurring   L2-L3: Degenerative facet spurring with ligamentum flavum thickening and anterolisthesis. The disc is bulging with bilateral paracentral to foraminal protrusion. Compressive spinal stenosis. Patent foramina   L3-L4: Disc space narrowing and bulging with right foraminal and far-lateral herniation deflecting the right L3 nerve root. Facet spurring and ligamentum flavum thickening. Advanced spinal stenosis.   L4-L5: Disc narrowing and bulging with right foraminal protrusion. Degenerative facet  spurring on both sides with ligamentum flavum thickening. Moderate spinal stenosis. Moderate right foraminal narrowing   L5-S1:Disc narrowing and bulging. Degenerative facet spurring which is bulky. Patent canal and foramina   IMPRESSION: 1. Generalized lumbar spine degeneration exacerbated by short pedicles. No significant change when compared to January 2022. 2. Compressive spinal stenosis at L2-3 and L3-4. Moderate spinal stenosis at L4-5. 3. L3-4 right foraminal and extraforaminal herniation deflecting the right L3 nerve root. 4. L4-5 moderate right foraminal stenosis. 5. T11-12 chronic left foraminal protrusion and stenosis.     Electronically Signed   By: Tiburcio Pea M.D.   On: 05/20/2022 15:22        Subjective: Chief Complaint  Patient presents with   Lower Back - Follow-up   Neck - Follow-up    66 year old female right hand dominant with history of neck and arm pain and numbness and weakness, increasing difficulty with Grip and with writing her name due to clumbsiness. She has been experiencing increasing right dorsal hand and right middle finger pain. Also difficulty with prolong standing and walking. No bowel or bladder difficulty.     Review of Systems  Constitutional: Negative.   HENT: Negative.    Eyes: Negative.   Respiratory: Negative.    Cardiovascular: Negative.   Gastrointestinal: Negative.   Endocrine: Negative.   Genitourinary: Negative.   Musculoskeletal: Negative.  Skin: Negative.   Allergic/Immunologic: Negative.   Neurological: Negative.   Hematological: Negative.   Psychiatric/Behavioral: Negative.       Objective: Vital Signs: BP 125/75   Pulse (!) 106   Ht 5\' 2"  (1.575 m)   Wt 210 lb (95.3 kg)   BMI 38.41 kg/m   Physical Exam Constitutional:      Appearance: She is well-developed.  HENT:     Head: Normocephalic and atraumatic.  Eyes:     Pupils: Pupils are equal, round, and reactive to light.  Pulmonary:      Effort: Pulmonary effort is normal.     Breath sounds: Normal breath sounds.  Abdominal:     General: Bowel sounds are normal.     Palpations: Abdomen is soft.  Musculoskeletal:     Cervical back: Normal range of motion and neck supple.     Lumbar back: Negative right straight leg raise test and negative left straight leg raise test.  Skin:    General: Skin is warm and dry.  Neurological:     Mental Status: She is alert and oriented to person, place, and time.  Psychiatric:        Behavior: Behavior normal.        Thought Content: Thought content normal.        Judgment: Judgment normal.     Back Exam   Tenderness  The patient is experiencing tenderness in the lumbar.  Range of Motion  Extension:  abnormal  Flexion:  abnormal  Lateral bend right:  abnormal  Lateral bend left:  abnormal  Rotation right:  abnormal  Rotation left:  abnormal   Muscle Strength  Right Quadriceps:  5/5  Left Quadriceps:  5/5  Right Hamstrings:  5/5   Tests  Straight leg raise right: negative Straight leg raise left: negative  Reflexes  Patellar:  2/4 Achilles:  2/4  Comments:  Right hand finger extension weakness. Numbness and tingling into the right radial 3 digits.  Weakness right FE 4/5, right biceps 4/5. Pain with cervical spine ROM.       Specialty Comments:  No specialty comments available.  Imaging: No results found.   PMFS History: Patient Active Problem List   Diagnosis Date Noted   HYPERTENSION 09/02/2008   DYSPNEA 09/02/2008   DIABETES, TYPE 2 09/01/2008   Past Medical History:  Diagnosis Date   Diabetes mellitus without complication (HCC)    Hypertension     Family History  Problem Relation Age of Onset   Breast cancer Neg Hx     Past Surgical History:  Procedure Laterality Date   ABDOMINAL HYSTERECTOMY     BREAST BIOPSY Right 08/2020   FOOT SURGERY     TOOTH EXTRACTION N/A 08/15/2020   Procedure: DENTAL RESTORATION/EXTRACTIONS;  Surgeon: 10/15/2020, DDS;  Location: [facility];  Service: Oral Surgery;  Laterality: N/A;   Social History   Occupational History   Occupation: housekeeping   Occupation: [employer]  Tobacco Use   Smoking status: Former    Types: Cigarettes, E-cigarettes   Smokeless tobacco: Current  Vaping Use   Vaping Use: Former  Substance and Sexual Activity   Alcohol use: No   Drug use: Never   Sexual activity: Not on file

## 2022-06-06 ENCOUNTER — Telehealth: Payer: Self-pay

## 2022-06-06 NOTE — Telephone Encounter (Signed)
Received voice mail from patient inquiring about scheduling surgery.  She was seen 07/27.  No surgery sheet.  Please advise how you want to handle.

## 2022-06-13 NOTE — Telephone Encounter (Signed)
Printed for Nitka 

## 2022-06-14 ENCOUNTER — Telehealth: Payer: Self-pay | Admitting: Specialist

## 2022-06-14 NOTE — Telephone Encounter (Signed)
REFILL ON PAIN MEDS. ° °

## 2022-06-18 ENCOUNTER — Other Ambulatory Visit: Payer: Self-pay | Admitting: Orthopaedic Surgery

## 2022-06-18 ENCOUNTER — Telehealth: Payer: Self-pay

## 2022-06-18 NOTE — Telephone Encounter (Signed)
Please advise 

## 2022-06-18 NOTE — Telephone Encounter (Signed)
I called patient to advise that her surgery is still pending.  Dr. Otelia Sergeant wrote the order and upon review Dr. Ophelia Charter wants to further discuss with Dr. Otelia Sergeant.  Dr. Otelia Sergeant will be out of the office for another week.  Patient is requesting a refill of Oxycodone.  Please call her [phone].  Thanks!  Marcelle Bebout

## 2022-06-19 ENCOUNTER — Other Ambulatory Visit: Payer: Self-pay | Admitting: Surgery

## 2022-06-19 MED ORDER — TRAMADOL HCL 50 MG PO TABS: 50.0000 mg | ORAL_TABLET | Freq: Two times a day (BID) | ORAL | 0 refills | Status: AC | PRN

## 2022-06-19 NOTE — Telephone Encounter (Signed)
LMOM for patient of the below message  

## 2022-07-02 ENCOUNTER — Telehealth: Payer: Self-pay

## 2022-07-02 NOTE — Telephone Encounter (Signed)
Patient left voice mail inquiring about scheduling surgery.  Dr. Otelia Sergeant to decide next step.  Thanks!

## 2022-07-16 NOTE — Telephone Encounter (Signed)
Called patient and scheduled to see Dr. Christell Constant on 07/31/22 10 a.m.

## 2022-07-25 ENCOUNTER — Ambulatory Visit (INDEPENDENT_AMBULATORY_CARE_PROVIDER_SITE_OTHER): Payer: Medicare Other | Admitting: Specialist

## 2022-07-25 ENCOUNTER — Encounter: Payer: Self-pay | Admitting: Specialist

## 2022-07-25 VITALS — BP 143/81 | HR 84 | Ht 62.0 in | Wt 210.0 lb

## 2022-07-25 DIAGNOSIS — M545 Low back pain, unspecified: Secondary | ICD-10-CM

## 2022-07-25 DIAGNOSIS — M4316 Spondylolisthesis, lumbar region: Secondary | ICD-10-CM | POA: Diagnosis not present

## 2022-07-25 DIAGNOSIS — M25649 Stiffness of unspecified hand, not elsewhere classified: Secondary | ICD-10-CM

## 2022-07-25 DIAGNOSIS — M79641 Pain in right hand: Secondary | ICD-10-CM

## 2022-07-25 DIAGNOSIS — Z794 Long term (current) use of insulin: Secondary | ICD-10-CM

## 2022-07-25 DIAGNOSIS — M542 Cervicalgia: Secondary | ICD-10-CM

## 2022-07-25 DIAGNOSIS — M5416 Radiculopathy, lumbar region: Secondary | ICD-10-CM

## 2022-07-25 DIAGNOSIS — M48062 Spinal stenosis, lumbar region with neurogenic claudication: Secondary | ICD-10-CM | POA: Diagnosis not present

## 2022-07-25 DIAGNOSIS — M4807 Spinal stenosis, lumbosacral region: Secondary | ICD-10-CM

## 2022-07-25 DIAGNOSIS — M4802 Spinal stenosis, cervical region: Secondary | ICD-10-CM

## 2022-07-25 DIAGNOSIS — E1142 Type 2 diabetes mellitus with diabetic polyneuropathy: Secondary | ICD-10-CM

## 2022-07-25 DIAGNOSIS — M4012 Other secondary kyphosis, cervical region: Secondary | ICD-10-CM

## 2022-07-25 DIAGNOSIS — M79642 Pain in left hand: Secondary | ICD-10-CM

## 2022-07-25 DIAGNOSIS — M25551 Pain in right hip: Secondary | ICD-10-CM

## 2022-07-25 DIAGNOSIS — G8929 Other chronic pain: Secondary | ICD-10-CM

## 2022-07-25 MED ORDER — HYDROCODONE-ACETAMINOPHEN 5-325 MG PO TABS: 1.0000 | ORAL_TABLET | Freq: Four times a day (QID) | ORAL | 0 refills | Status: AC | PRN

## 2022-07-25 NOTE — Addendum Note (Signed)
Addended by: Minda Ditto, Alyse Low N on: 07/25/2022 12:21 PM   Modules accepted: Orders

## 2022-07-25 NOTE — Progress Notes (Signed)
Office Visit Note   Patient: Amanda Hatfield.           Date of Birth: [birth year]           MRN: [id number] Visit Date: 07/25/2022              Requested by: Fleet Contras, MD [address] [place],  [place] [address] PCP: Fleet Contras, MD   Assessment & Plan: Visit Diagnoses:  1. Spinal stenosis of lumbar region with neurogenic claudication   2. Radiculopathy, lumbar region   3. Spinal stenosis of cervical region   4. Spondylolisthesis of lumbar region   5. Spinal stenosis of lumbosacral region   6. Cervicalgia   7. Spondylolisthesis, lumbar region   8. Chronic low back pain, unspecified back pain laterality, unspecified whether sciatica present   9. Bilateral hand pain   10. Type 2 diabetes mellitus with diabetic polyneuropathy, with long-term current use of insulin (HCC)   11. Pain in right hip   12. Morning joint stiffness of hand, unspecified laterality   13. Other secondary kyphosis, cervical region     Plan: void overhead lifting and overhead use of the arms. Do not lift greater than 5 lbs. Adjust head rest in vehicle to prevent hyperextension if rear ended. Take extra precautions to avoid falling, including use of a cane if you feel weak. Scheduling secretary Tivis Ringer. will call you to arrange for surgery for your cervical spine. If you wish a second opinion please let us know and we can arrange for you. If you have worsening arm or leg numbness or weakness please call or go to an ER. We will contact your cardiologist and primary care physicians to seek clearance for your surgery. Surgery will be an anterior cervical discectomy and fusion at the C3-4, C4-5, C5-6 and C6-7 levels with decompression of the cervical spinal canal  and removal of bone pressing on the spinal cord. Fusion would then be done with bone grafting and plate and screws, Local bone graft as well and allograft bone graft and vivigen.  Risks of surgery include risks of infection, bleeding  and risks to the spinal cord and  Risks of sore throat and difficulty swallowing which should  Improve over the next 4-6 weeks following surgery. Surgery is indicated due to upper extremity radiculopathy, lermittes phenomena and falls. In the future surgery at adjacent levels may be necessary but these levels do not appear to be related to your current symptoms or signs.  I recommend right UE doppler arterial study to ensure normal arterial blood flow.  Follow-Up Instructions: Return in about 4 weeks (around 06/27/2022).  Follow-Up Instructions: No follow-ups on file.   Orders:  No orders of the defined types were placed in this encounter.  No orders of the defined types were placed in this encounter.     Procedures: No procedures performed   Clinical Data: No additional findings.   Subjective: Chief Complaint  Patient presents with   Lower Back - Follow-up    66 year old right handed female with multilevle spondylosis and cervical stenosis  C3-4, C4-5, C5-6 and C6-7 with right UE radicular pain. She also has lumbar spondylolisthesis and symptoms that suggest neurogenic claudication. She is dropping items and complains of right more than left arm pain numbness and tingling. Left arm has intermittant pain with discomfort down the left arm to the left elbow and just above it.  Pain with turning head side to side and with upwards gaze. MRI  with spondylosis and DDD.  Bilateral knee OA, and back pain that limits her standing and walking tolerance. She is scheduled to see Dr. Christell Constant next Thursday for evaluation and consideration of a 4 level ACDF. Has urgency of bowel and bladder.     Review of Systems  Constitutional: Negative.   HENT: Negative.    Eyes: Negative.   Respiratory: Negative.    Cardiovascular: Negative.   Gastrointestinal: Negative.   Endocrine: Negative.   Genitourinary: Negative.   Musculoskeletal: Negative.   Skin: Negative.   Allergic/Immunologic: Negative.    Neurological: Negative.   Hematological: Negative.   Psychiatric/Behavioral: Negative.       Objective: Vital Signs: BP (!) 143/81 (BP Location: Left Arm, Patient Position: Sitting)   Pulse 84   Ht 5\' 2"  (1.575 m)   Wt 210 lb (95.3 kg)   BMI 38.41 kg/m   Physical Exam Constitutional:      Appearance: She is well-developed.  HENT:     Head: Normocephalic and atraumatic.  Eyes:     Pupils: Pupils are equal, round, and reactive to light.  Pulmonary:     Effort: Pulmonary effort is normal.     Breath sounds: Normal breath sounds.  Abdominal:     General: Bowel sounds are normal.     Palpations: Abdomen is soft.  Musculoskeletal:     Cervical back: Normal range of motion and neck supple.     Lumbar back: Negative right straight leg raise test and negative left straight leg raise test.  Skin:    General: Skin is warm and dry.  Neurological:     Mental Status: She is alert and oriented to person, place, and time.  Psychiatric:        Behavior: Behavior normal.        Thought Content: Thought content normal.        Judgment: Judgment normal.     Back Exam   Tenderness  The patient is experiencing tenderness in the cervical.  Range of Motion  Extension:  50 abnormal  Flexion:  50 abnormal  Lateral bend right:  abnormal  Lateral bend left:  abnormal  Rotation right:  abnormal  Rotation left:  abnormal   Muscle Strength  Right Quadriceps:  5/5  Left Quadriceps:  5/5  Right Hamstrings:  5/5  Left Hamstrings:  5/5   Tests  Straight leg raise right: negative Straight leg raise left: negative  Other  Toe walk: normal Heel walk: normal Sensation: normal Gait: normal       Specialty Comments:  No specialty comments available.  Imaging: No results found.   PMFS History: Patient Active Problem List   Diagnosis Date Noted   HYPERTENSION 09/02/2008   DYSPNEA 09/02/2008   DIABETES, TYPE 2 09/01/2008   Past Medical History:  Diagnosis Date    Diabetes mellitus without complication (HCC)    Hypertension     Family History  Problem Relation Age of Onset   Breast cancer Neg Hx     Past Surgical History:  Procedure Laterality Date   ABDOMINAL HYSTERECTOMY     BREAST BIOPSY Right 08/2020   FOOT SURGERY     TOOTH EXTRACTION N/A 08/15/2020   Procedure: DENTAL RESTORATION/EXTRACTIONS;  Surgeon: 10/15/2020, DDS;  Location: [facility];  Service: Oral Surgery;  Laterality: N/A;   Social History   Occupational History   Occupation: housekeeping   Occupation: Amanda Hatfield  Tobacco Use   Smoking status: Former    Types: Cigarettes,  E-cigarettes   Smokeless tobacco: Current  Vaping Use   Vaping Use: Former  Substance and Sexual Activity   Alcohol use: No   Drug use: Never   Sexual activity: Not on file

## 2022-07-25 NOTE — Patient Instructions (Signed)
Plan: void overhead lifting and overhead use of the arms. Do not lift greater than 5 lbs. Adjust head rest in vehicle to prevent hyperextension if rear ended. Take extra precautions to avoid falling, including use of a cane if you feel weak. Scheduling secretary Kandice Hams. will call you to arrange for surgery for your cervical spine. If you wish a second opinion please let us know and we can arrange for you. If you have worsening arm or leg numbness or weakness please call or go to an ER. We will contact your cardiologist and primary care physicians to seek clearance for your surgery. Surgery will be an anterior cervical discectomy and fusion at the C3-4, C4-5, C5-6 and C6-7 levels with decompression of the cervical spinal canal  and removal of bone pressing on the spinal cord. Fusion would then be done with bone grafting and plate and screws, Local bone graft as well and allograft bone graft and vivigen.  Risks of surgery include risks of infection, bleeding and risks to the spinal cord and  Risks of sore throat and difficulty swallowing which should  Improve over the next 4-6 weeks following surgery. Surgery is indicated due to upper extremity radiculopathy, lermittes phenomena and falls. In the future surgery at adjacent levels may be necessary but these levels do not appear to be related to your current symptoms or signs.  I recommend right UE doppler arterial study to ensure normal arterial blood flow.  Follow-Up Instructions: Return in about 4 weeks (around 06/27/2022).

## 2022-07-30 ENCOUNTER — Other Ambulatory Visit: Payer: Self-pay | Admitting: Specialist

## 2022-07-30 ENCOUNTER — Ambulatory Visit (HOSPITAL_COMMUNITY)
Admission: RE | Admit: 2022-07-30 | Discharge: 2022-07-30 | Disposition: A | Discharge status: Home / Self Care | Payer: Medicare Other | Source: Ambulatory Visit | Attending: Internal Medicine | Admitting: Internal Medicine

## 2022-07-30 DIAGNOSIS — M79642 Pain in left hand: Secondary | ICD-10-CM | POA: Insufficient documentation

## 2022-07-30 DIAGNOSIS — E1142 Type 2 diabetes mellitus with diabetic polyneuropathy: Secondary | ICD-10-CM

## 2022-07-30 DIAGNOSIS — R208 Other disturbances of skin sensation: Secondary | ICD-10-CM

## 2022-07-30 DIAGNOSIS — Z794 Long term (current) use of insulin: Secondary | ICD-10-CM | POA: Diagnosis present

## 2022-07-30 DIAGNOSIS — M25649 Stiffness of unspecified hand, not elsewhere classified: Secondary | ICD-10-CM

## 2022-07-30 DIAGNOSIS — M79641 Pain in right hand: Secondary | ICD-10-CM | POA: Insufficient documentation

## 2022-07-31 ENCOUNTER — Ambulatory Visit (INDEPENDENT_AMBULATORY_CARE_PROVIDER_SITE_OTHER): Payer: Medicare Other | Admitting: Orthopedic Surgery

## 2022-07-31 ENCOUNTER — Encounter: Payer: Self-pay | Admitting: Orthopedic Surgery

## 2022-07-31 VITALS — BP 117/78 | HR 81 | Ht 62.0 in | Wt 210.0 lb

## 2022-07-31 DIAGNOSIS — G5601 Carpal tunnel syndrome, right upper limb: Secondary | ICD-10-CM | POA: Diagnosis not present

## 2022-07-31 DIAGNOSIS — M5412 Radiculopathy, cervical region: Secondary | ICD-10-CM

## 2022-07-31 NOTE — Progress Notes (Signed)
Orthopedic Spine Surgery Office Note  Patient name: Amanda Hatfield. Patient MRN: [id number] Date of visit: 07/31/22  History:  Patient is a 66 year old female who presents today for her cervical spine Pain location: neck, right thumb/index/middle fingers, bilateral shoulders Severity of pain: severe in her hand to the point that she has trouble gripping objects and using it, her neck feels stiff and that causes her discomfort, her shoulder pain is her least significant Mechanism of injury: none, insidious onset Duration of symptoms: neck stiffness has been years, right hand pain within the last couple of months Radicular symptoms: yes, neck pain radiates into bilateral shoulders but does not go distal to the shoulder Weakness: no, but is not able to use full strength due to pain in right hand Difficulty with fine motor skills (e.g., buttoning shirts, handwriting): denies Symptoms of imbalance: denies Paresthesias and numbness: yes, gets tingling in the same distribution as pain Bowel or bladder incontinence: denies Saddle anesthesia: denies Has noticed that side sleeping helps with her neck stiffness and discomfort. Laying on on her back makes it worse. In regards to her hand, using it makes it worse. Rest makes it better. Compression sleeve device seems to help too.  Reports having a nerve study done within the last month that did not show anything  Treatments tried: tylenol, ibuprofen, compression type device over her right hand, activity modification  Review of systems: Denies fevers and chills, night sweats, unexplained weight loss, history of cancer, pain that wakes them at night  Past medical history: HTN DM Dyspnea  Allergies: NKDA  Past surgical history:  Hysterectomy Foot surgery Tooth extraction  Social history: Former smoker Denies recreational drug use  Physical Exam:  General: no acute distress, appears stated age Neurologic: alert, answering questions  appropriately, following commands Respiratory: unlabored breathing on room air, symmetric chest rise Psychiatric: appropriate affect, normal cadence to speech   MSK (spine):  -Strength exam      Left  Right Grip strength                5/5  4/5 Interosseus   5/5   5/5 Wrist extension  5/5  5/5 Wrist flexion   5/5  5/5 Elbow flexion   5/5  5/5 Deltoid    5/5  5/5  EHL    5/5  5/5 TA    5/5  5/5 GSC    5/5  5/5 Knee extension  5/5  5/5 Hip flexion   5/5  5/5  -Sensory exam    Sensation intact to light touch in L3-S1 nerve distributions of bilateral lower extremities  Sensation intact to light touch in C5-T1 nerve distributions of bilateral upper extremities  -Brachioradialis DTR: 2/4 on the left, 2/4 on the right -Biceps DTR: 2/4 on the left, 2/4 on the right -Patellar tendon DTR: 2/4 on the left, 2/4 on the right  -Spurling: negative bilaterally -Hoffman sign: negative bilaterally -Inverted brachioradialis reflex: negative bilaterally -Clonus: no beats bilaterally -Interosseous wasting: none, however thenar atrophy was noted on the right side -Grip and release test: slower on the right side due to pain, negative on the left -Romberg: negative -Normal gait -Imbalance with tandem gait: no  -Positive durkan and tinels on the right, negative phalens on the right, negative tinels and phalens at the elbow on the right. TTP over the 1st dorsal compartment and over the volar wrist. Positive finkelstein on the right. Negative tinels and phalens at the elbow and the wrist on the left.  Negative durkan on the left.   Left shoulder exam: negative hawkins, no pain through range of motion, negative jobe test, negative drop arm sign, negative yergason Right shoulder exam: positive hawkins, no pain through range of motion, negative jobe test, negative drop arm sign, negative yergason  Imaging: MRI of cervical spine from 05/19/22 was independently reviewed and interpreted, showing DDD at  C4/5, C5/6, and C6/7. Foraminal stenosis at those levels as well. No T2 cord signal change noted.   XR of the cervical spine from 04/22/22 independently reviewed and interpreted today, showing disc height loss and anterior osteophyte formation at C4/5, C5/6, and C6/7. Anterolisthesis at C3/4 and C4/5 on flexion that reduces on extension views.  Assessment: Patient is a 66 year old female with symptoms concerning for carpal tunnel syndrome and possible de quervains. Does have significant cervical stenosis and radicular type symptoms, but her more symptomatic area seems to be her hand on today's visit.   Plan: -The patient does have symptoms of neck pain associated with stiffness. Her imaging does show degenerative changes that could explain that. I explained that there are conservative treatments that we should try for that and there is not a good surgical option for neck pain. She does have some pain that radiates into her shoulders but that is milder than her right hand pain. She has multiple positive physical exam findings for hand pathology and the tenderness to palpation over the hand points away from her pain being of a cervical etiology. I recommended that she see a hand surgeon to look at her right hand and address that first especially since it is her most symptomatic area at this time. I provided her with a referral to see a hand surgeon. Patient expressed understanding and agreement with the plan. She should return to my office in 3 months to reevaluate her radicular pain. No new XRs needed at that time.   Ileene Rubens, MD Orthopedic Surgeon

## 2022-08-13 ENCOUNTER — Other Ambulatory Visit: Payer: Self-pay

## 2022-08-13 NOTE — Telephone Encounter (Signed)
Patient would like a Rx refill on Hydrocodone sent to her pharmacy. CB# [phone].  Please advise.  Thank you.

## 2022-08-13 NOTE — Addendum Note (Signed)
Addended by: Minda Ditto, Geoffery Spruce on: 08/13/2022 04:45 PM   Modules accepted: Orders

## 2022-08-15 NOTE — Telephone Encounter (Signed)
I called and advised that her Rx is pending with Dr. Louanne Skye. She was not happy that this had not been addressed yet.

## 2022-09-16 ENCOUNTER — Other Ambulatory Visit: Payer: Self-pay | Admitting: Internal Medicine

## 2022-09-16 DIAGNOSIS — Z1231 Encounter for screening mammogram for malignant neoplasm of breast: Secondary | ICD-10-CM

## 2022-10-30 ENCOUNTER — Ambulatory Visit (INDEPENDENT_AMBULATORY_CARE_PROVIDER_SITE_OTHER): Payer: Medicare Other | Admitting: Orthopedic Surgery

## 2022-10-30 DIAGNOSIS — G5601 Carpal tunnel syndrome, right upper limb: Secondary | ICD-10-CM

## 2022-10-30 NOTE — Progress Notes (Signed)
Orthopedic Spine Surgery Office Note  Assessment: Patient is a 66 y.o. female with right hand pain over the first dorsal compartment. Positive finkelstein. Has significant degenerative changes and stenosis with in the cervical spine but does not have myelopathy and her symptoms seem to be more hand etiology than radiculopathy at this time   Plan: -Recommended she see a hand surgeon. Referral again provided to her today. Took down a good phone number for my office to reach her when that referral gets processed -If hand surgeon, feels this is more radicular in nature, may get EMG/NCS to evaluate further -Patient should return to office in 12 weeks - will monitor for any symptoms/findings consistent with myelopathy, x-rays at next visit: cervical AP/lateral/flex/ex   Patient expressed understanding of the plan and all questions were answered to the patient's satisfaction.   __________________________________________________________________________  History: Patient is a 66 y.o. female who has been previously seen in the office for symptoms consistent with cervical radiculopathy. Since the last visit, symptom intensity remained the same. She is still having neck pain. It is mostly axial neck pain. She does have some pain in the left paraspinal muscles at her neck. No pain radiating into her arms today. Her biggest issue is still her right hand. Has pain over the distal forearm that goes into the thumb. She never saw a hand surgeon which was recommended at the last visit since I explained it would be a less invasive and easier to recover from surgery if her symptoms are coming from the hand itself. She still having trouble doing any daily activities due to the hand pain. Only rest seems to make the pain tolerable. Does get some tingling into her fingers on the right side. No other numbness or paresthesias.   Previous treatments: tylenol, ibuprofen, compression type device over her right hand, activity  modification   COPY OF FIRST NOTE Patient is a 66 year old female who presents today for her cervical spine Pain location: neck, right thumb/index/middle fingers, bilateral shoulders Severity of pain: severe in her hand to the point that she has trouble gripping objects and using it, her neck feels stiff and that causes her discomfort, her shoulder pain is her least significant Mechanism of injury: none, insidious onset Duration of symptoms: neck stiffness has been years, right hand pain within the last couple of months Radicular symptoms: yes, neck pain radiates into bilateral shoulders but does not go distal to the shoulder Weakness: no, but is not able to use full strength due to pain in right hand Difficulty with fine motor skills (e.g., buttoning shirts, handwriting): denies Symptoms of imbalance: denies Paresthesias and numbness: yes, gets tingling in the same distribution as pain Bowel or bladder incontinence: denies Saddle anesthesia: denies Has noticed that side sleeping helps with her neck stiffness and discomfort. Laying on on her back makes it worse. In regards to her hand, using it makes it worse. Rest makes it better. Compression sleeve device seems to help too.  Reports having a nerve study done within the last month that did not show anything   Treatments tried: tylenol, ibuprofen, compression type device over her right hand, activity modification END OF COPY  Physical Exam:  General: no acute distress, appears stated age Neurologic: alert, answering questions appropriately, following commands Respiratory: unlabored breathing on room air, symmetric chest rise Psychiatric: appropriate affect, normal cadence to speech   MSK (spine):  -Strength exam  Left                  Right Grip strength                5/5                  4/5 Interosseus                  5/5                  5/5 Wrist extension            5/5                   5/5 Wrist flexion                 5/5                  5/5 Elbow flexion                5/5                  5/5 Deltoid                          5/5                  5/5   EHL                              5/5                  5/5 TA                                 5/5                  5/5 GSC                             5/5                  5/5 Knee extension            5/5                  5/5 Hip flexion                    5/5                  5/5   -Sensory exam                           Sensation intact to light touch in L3-S1 nerve distributions of bilateral lower extremities             Sensation intact to light touch in C5-T1 nerve distributions of bilateral upper extremities   -Brachioradialis DTR: 2/4 on the left, 2/4 on the right -Biceps DTR: 2/4 on the left, 2/4 on the right -Patellar tendon DTR: 2/4 on the left, 2/4 on the right   -Spurling: negative bilaterally -Hoffman sign: negative bilaterally -Inverted brachioradialis reflex: negative bilaterally -Clonus: no beats bilaterally -Interosseous wasting: none, however thenar atrophy was noted on the right side -Grip and release  test: slower on the right side due to pain, negative on the left -Romberg: negative -Normal gait -Imbalance with tandem gait: no   -Positive durkan and tinels on the right, negative phalens on the right, negative tinels and phalens at the elbow on the right. TTP over the 1st dorsal compartment and over the volar wrist. Positive finkelstein on the right.    Left shoulder exam: no pain through range of motion Right shoulder exam: no pain through range of motion  Imaging: XR of the cervical spine from 04/22/22 was previously independently reviewed and interpreted, showing disc height loss and anterior osteophyte formation at C4/5, C5/6, and C6/7. Anterolisthesis at C3/4 and C4/5 on flexion that reduces on extension views.   MRI of cervical spine from 05/19/22 was previously  independently reviewed and interpreted, showing DDD at C4/5, C5/6, and C6/7. Central stenosis at C3/4, C4/5, C5/6. Foraminal stenosis at C6/7 bilaterally. Foraminal stenosis on the right at C7/T1. Bilateral foraminal stenosis at C5/6, worse on the left. No T2 cord signal change    Patient name: Amanda Hatfield. Patient MRN: [id number] Date of visit: 10/30/22

## 2022-10-31 ENCOUNTER — Ambulatory Visit: Payer: Medicare Other

## 2022-12-20 ENCOUNTER — Ambulatory Visit: Payer: Medicare Other

## 2023-01-15 ENCOUNTER — Other Ambulatory Visit: Payer: Self-pay | Admitting: Internal Medicine

## 2023-01-16 LAB — COMPLETE METABOLIC PANEL WITH GFR
AG Ratio: 1.7 (calc) (ref 1.0–2.5)
ALT: 21 U/L (ref 6–29)
AST: 18 U/L (ref 10–35)
Albumin: 4.5 g/dL (ref 3.6–5.1)
Alkaline phosphatase (APISO): 81 U/L (ref 37–153)
BUN/Creatinine Ratio: 17 (calc) (ref 6–22)
BUN: 19 mg/dL (ref 7–25)
CO2: 23 mmol/L (ref 20–32)
Calcium: 9.8 mg/dL (ref 8.6–10.4)
Chloride: 105 mmol/L (ref 98–110)
Creat: 1.12 mg/dL — ABNORMAL HIGH (ref 0.50–1.05)
Globulin: 2.7 g/dL (calc) (ref 1.9–3.7)
Glucose, Bld: 195 mg/dL — ABNORMAL HIGH (ref 65–99)
Potassium: 3.9 mmol/L (ref 3.5–5.3)
Sodium: 140 mmol/L (ref 135–146)
Total Bilirubin: 1.1 mg/dL (ref 0.2–1.2)
Total Protein: 7.2 g/dL (ref 6.1–8.1)
eGFR: 54 mL/min/{1.73_m2} — ABNORMAL LOW (ref >=60)

## 2023-01-16 LAB — CBC
HCT: 42.9 % (ref 35.0–45.0)
Hemoglobin: 14.5 g/dL (ref 11.7–15.5)
MCH: 32.6 pg (ref 27.0–33.0)
MCHC: 33.8 g/dL (ref 32.0–36.0)
MCV: 96.4 fL (ref 80.0–100.0)
MPV: 11.6 fL (ref 7.5–12.5)
Platelets: 216 10*3/uL (ref 140–400)
RBC: 4.45 10*6/uL (ref 3.80–5.10)
RDW: 12.8 % (ref 11.0–15.0)
WBC: 8.3 10*3/uL (ref 3.8–10.8)

## 2023-01-16 LAB — LIPID PANEL
Cholesterol: 176 mg/dL (ref <200)
HDL: 36 mg/dL — ABNORMAL LOW (ref >=50)
LDL Cholesterol (Calc): 105 mg/dL (calc) — ABNORMAL HIGH
Non-HDL Cholesterol (Calc): 140 mg/dL (calc) — ABNORMAL HIGH (ref <130)
Total CHOL/HDL Ratio: 4.9 (calc) (ref <5.0)
Triglycerides: 230 mg/dL — ABNORMAL HIGH (ref <150)

## 2023-01-16 LAB — VITAMIN D 25 HYDROXY (VIT D DEFICIENCY, FRACTURES): Vit D, 25-Hydroxy: 28 ng/mL — ABNORMAL LOW (ref 30–100)

## 2023-01-16 LAB — TSH: TSH: 2.72 mIU/L (ref 0.40–4.50)

## 2023-01-29 ENCOUNTER — Ambulatory Visit (INDEPENDENT_AMBULATORY_CARE_PROVIDER_SITE_OTHER): Payer: 59 | Admitting: Orthopedic Surgery

## 2023-01-29 ENCOUNTER — Other Ambulatory Visit (INDEPENDENT_AMBULATORY_CARE_PROVIDER_SITE_OTHER): Payer: 59

## 2023-01-29 DIAGNOSIS — G5601 Carpal tunnel syndrome, right upper limb: Secondary | ICD-10-CM

## 2023-01-29 DIAGNOSIS — M5412 Radiculopathy, cervical region: Secondary | ICD-10-CM

## 2023-01-29 DIAGNOSIS — M778 Other enthesopathies, not elsewhere classified: Secondary | ICD-10-CM

## 2023-01-29 DIAGNOSIS — M4802 Spinal stenosis, cervical region: Secondary | ICD-10-CM

## 2023-01-29 NOTE — Progress Notes (Addendum)
Orthopedic Spine Surgery Office Note  Assessment: Patient is a 67 y.o. female with right hand pain.  Has positive Finkelstein's test and tenderness over the first dorsal compartment.  Also has numbness in the radial digits with a positive Durkan's test   Plan: -Referred for an EMG/NCS to evaluate for peripheral nerve entrapment -Placed referral for hand surgery to evaluate for first dorsal compartment release and possible carpal tunnel syndrome -Patient should return to office in 5 weeks, x-rays at next visit: None   Patient expressed understanding of the plan and all questions were answered to the patient's satisfaction.   __________________________________________________________________________  History: Patient is a 67 y.o. female who has been previously seen in the office for symptoms of right hand pain.  Patient has not been seen by hand surgeon since last time she was seen by me.  She says she never got a call.  She has not tried any new treatment since she was seen.  She is still having persistent pain over the radial aspect of the distal forearm and wrist.  She also reports decreased sensation in her thumb, index, and middle fingers.  She feels that because of her hand pain she has had issues with using the hand.  She has no similar dexterity issues on the left side.  Has not noticed any new imbalance.  No pain radiating distal from the neck into the arm or the proximal forearm.  Previous treatments: Tylenol, ibuprofen, wrist brace, activity modification  Physical Exam:  General: no acute distress, appears stated age Neurologic: alert, answering questions appropriately, following commands Respiratory: unlabored breathing on room air, symmetric chest rise Psychiatric: appropriate affect, normal cadence to speech   MSK (spine):   -Strength exam                                                   Left                  Right Grip strength                5/5                   4/5 Interosseus                  5/5                  4/5 Wrist extension            5/5                  5/5 Wrist flexion                 5/5                  5/5 Elbow flexion                5/5                  5/5 Deltoid                          5/5                  5/5   EHL  5/5                  5/5 TA                                 5/5                  5/5 GSC                             5/5                  5/5 Knee extension            5/5                  5/5 Hip flexion                    5/5                  5/5   -Sensory exam                           Sensation intact to light touch in L3-S1 nerve distributions of bilateral lower extremities             Sensation intact to light touch in C5-T1 nerve distributions of bilateral upper extremities   -Brachioradialis DTR: 2/4 on the left, 2/4 on the right -Biceps DTR: 2/4 on the left, 2/4 on the right -Patellar tendon DTR: 2/4 on the left, 2/4 on the right   -Spurling: negative bilaterally -Hoffman sign: negative bilaterally -Inverted brachioradialis reflex: negative bilaterally -Clonus: no beats bilaterally -Interosseous wasting: none, however thenar atrophy was noted on the right side -Grip and release test: slower on the right side due to pain, negative on the left -Romberg: negative -Normal gait -Imbalance with tandem gait: no  Left shoulder exam: No pain through range of motion Right shoulder exam: No pain through range of motion  Tinel's at wrist: Negative bilaterally Phalen's at wrist: Negative bilaterally Durkan's: Positive on the right, negative on the left  Tinel's at elbow: Negative bilaterally  -Right hand exam: Tender palpation of the first dorsal compartment, no other tenderness palpation, positive Finkelstein, negative grind test  Imaging: XR of the cervical spine from 01/29/2023 was independently reviewed and interpreted, showing disc height loss and anterior  osteophyte formation at C4/5, C5/6, and C6/7. Anterolisthesis at C3/4 seen best on the neutral lateral that reduces with extension.  No fracture or dislocation.  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 Central stenosis at C3/4, C4/5, C5/6. Foraminal stenosis at C6/7 bilaterally. Foraminal stenosis on the right at C7/T1. Bilateral foraminal stenosis at C5/6, worse on the left. No T2 cord signal change.     Patient name: Amanda Hatfield. Patient MRN: [id number] Date of visit: 01/29/23

## 2023-02-04 ENCOUNTER — Ambulatory Visit
Admission: RE | Admit: 2023-02-04 | Discharge: 2023-02-04 | Disposition: A | Discharge status: Home / Self Care | Payer: 59 | Source: Ambulatory Visit | Attending: Internal Medicine | Admitting: Internal Medicine

## 2023-02-04 DIAGNOSIS — Z1231 Encounter for screening mammogram for malignant neoplasm of breast: Secondary | ICD-10-CM

## 2023-02-06 ENCOUNTER — Other Ambulatory Visit: Payer: Self-pay | Admitting: Internal Medicine

## 2023-02-06 DIAGNOSIS — R928 Other abnormal and inconclusive findings on diagnostic imaging of breast: Secondary | ICD-10-CM

## 2023-02-18 ENCOUNTER — Ambulatory Visit
Admission: RE | Admit: 2023-02-18 | Discharge: 2023-02-18 | Disposition: A | Discharge status: Home / Self Care | Payer: 59 | Source: Ambulatory Visit | Attending: Internal Medicine | Admitting: Internal Medicine

## 2023-02-18 DIAGNOSIS — R928 Other abnormal and inconclusive findings on diagnostic imaging of breast: Secondary | ICD-10-CM

## 2023-02-28 ENCOUNTER — Other Ambulatory Visit: Payer: Self-pay | Admitting: Internal Medicine

## 2023-02-28 DIAGNOSIS — N6489 Other specified disorders of breast: Secondary | ICD-10-CM

## 2023-03-05 ENCOUNTER — Ambulatory Visit: Payer: 59 | Admitting: Orthopedic Surgery

## 2023-05-14 ENCOUNTER — Ambulatory Visit: Payer: 59 | Admitting: Orthopedic Surgery

## 2023-05-21 ENCOUNTER — Ambulatory Visit
Admission: RE | Admit: 2023-05-21 | Discharge: 2023-05-21 | Disposition: A | Discharge status: Home / Self Care | Payer: 59 | Source: Ambulatory Visit | Attending: Internal Medicine | Admitting: Internal Medicine

## 2023-05-21 ENCOUNTER — Other Ambulatory Visit: Payer: Self-pay | Admitting: Internal Medicine

## 2023-05-21 DIAGNOSIS — N6489 Other specified disorders of breast: Secondary | ICD-10-CM

## 2023-05-21 DIAGNOSIS — N6320 Unspecified lump in the left breast, unspecified quadrant: Secondary | ICD-10-CM

## 2023-06-09 ENCOUNTER — Ambulatory Visit (INDEPENDENT_AMBULATORY_CARE_PROVIDER_SITE_OTHER): Payer: 59 | Admitting: Orthopedic Surgery

## 2023-06-09 DIAGNOSIS — M545 Low back pain, unspecified: Secondary | ICD-10-CM

## 2023-06-09 DIAGNOSIS — M25562 Pain in left knee: Secondary | ICD-10-CM

## 2023-06-09 DIAGNOSIS — M25561 Pain in right knee: Secondary | ICD-10-CM | POA: Diagnosis not present

## 2023-06-09 DIAGNOSIS — G8929 Other chronic pain: Secondary | ICD-10-CM | POA: Diagnosis not present

## 2023-06-09 NOTE — Progress Notes (Signed)
Orthopedic Spine Surgery Office Note   Assessment: Patient is a 67 y.o. female who had hand pain and numbness that has improved with carpal tunnel release at [facility]. Has low back pain and bilateral knee pain (unclear if radicular based on her description today)      Plan: -No operative intervention recommended at this time -Recommended PT for his low back and knee pain -OTC medications as needed for pain relief.  -Patient should return to office on an as needed basis, if she returned needs lumbar AP/lateral/flex/ex x-rays     Patient expressed understanding of the plan and all questions were answered to the patient's satisfaction.    __________________________________________________________________________   History: Patient is a 67 y.o. female who has been previously seen in the office for symptoms of right hand pain.  She underwent carpal tunnel release at [facility] on the right side and feels that her hand pain and numbness has improved.  It has taken some time but it is slowly getting better.  She does not have any pain radiating into either upper extremity.  Still has some trouble with fine motor skills in the right hand but no issues with fine motor skills in the left hand.  Has not noticed any unsteadiness with gait.  She wanted talk about her low back pain as well today.  She has a history of chronic low back pain.  She says it waxes and wanes in terms of intensity.  There is no trauma or injury that preceded the onset of pain.  She has not tried any specific treatment recently.  She also has bilateral knee pain.  She does not describe it as radiating from the back.  She points focally to the knee on both sides.  She states that she has some days where she does not have any pain in the knees and other days where she has significant pain in the knees.  Denies paresthesias and numbness.   Previous treatments: Tylenol, ibuprofen, wrist brace, activity modification, carpal tunnel release    Physical Exam:   General: no acute distress, appears stated age Neurologic: alert, answering questions appropriately, following commands Respiratory: unlabored breathing on room air, symmetric chest rise Psychiatric: appropriate affect, normal cadence to speech     MSK (spine):   -Strength exam                                                   Left                  Right Grip strength                5/5                  4/5 Interosseus                  5/5                  4/5 Wrist extension            5/5                  5/5 Wrist flexion                 5/5  5/5 Elbow flexion                5/5                  5/5 Deltoid                          5/5                  5/5   EHL                              5/5                  5/5 TA                                 5/5                  5/5 GSC                             5/5                  5/5 Knee extension            5/5                  5/5 Hip flexion                    5/5                  5/5   -Sensory exam                           Sensation intact to light touch in L3-S1 nerve distributions of bilateral lower extremities             Sensation intact to light touch in C5-T1 nerve distributions of bilateral upper extremities   -Brachioradialis DTR: 2/4 on the left, 2/4 on the right -Biceps DTR: 2/4 on the left, 2/4 on the right -Patellar tendon DTR: 2/4 on the left, 2/4 on the right   -Negative straight leg raise bilaterally -No beats of clonus bilaterally     Imaging: MRI of lumbar  spine from 05/19/22 was independently reviewed and interpreted, showing central 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  Bilateral lateral recess stenosis at L4/5.  Foraminal stenosis worse on the right at L2/3, L3/4, and L4/5.     Patient name: Amanda Hatfield. Patient MRN: [id number] Date of visit: 06/09/23

## 2023-06-16 ENCOUNTER — Ambulatory Visit: Payer: 59 | Admitting: Orthopedic Surgery

## 2023-06-17 ENCOUNTER — Encounter: Payer: Self-pay | Admitting: Internal Medicine

## 2023-06-17 ENCOUNTER — Other Ambulatory Visit: Payer: Self-pay | Admitting: Internal Medicine

## 2023-06-17 ENCOUNTER — Ambulatory Visit
Admission: RE | Admit: 2023-06-17 | Discharge: 2023-06-17 | Disposition: A | Discharge status: Home / Self Care | Payer: 59 | Source: Ambulatory Visit | Attending: Internal Medicine | Admitting: Internal Medicine

## 2023-06-17 ENCOUNTER — Ambulatory Visit: Admission: RE | Admit: 2023-06-17 | Payer: 59 | Source: Ambulatory Visit

## 2023-06-17 DIAGNOSIS — N6320 Unspecified lump in the left breast, unspecified quadrant: Secondary | ICD-10-CM

## 2023-10-10 ENCOUNTER — Ambulatory Visit
Admission: RE | Admit: 2023-10-10 | Discharge: 2023-10-10 | Disposition: A | Discharge status: Home / Self Care | Payer: 59 | Source: Ambulatory Visit | Attending: Internal Medicine | Admitting: Internal Medicine

## 2023-10-10 ENCOUNTER — Other Ambulatory Visit: Payer: Self-pay | Admitting: Internal Medicine

## 2023-10-10 DIAGNOSIS — N6320 Unspecified lump in the left breast, unspecified quadrant: Secondary | ICD-10-CM

## 2024-05-27 ENCOUNTER — Other Ambulatory Visit (HOSPITAL_BASED_OUTPATIENT_CLINIC_OR_DEPARTMENT_OTHER): Payer: Self-pay | Admitting: Nurse Practitioner

## 2024-05-27 ENCOUNTER — Other Ambulatory Visit: Payer: Self-pay | Admitting: Internal Medicine

## 2024-05-27 DIAGNOSIS — Z0000 Encounter for general adult medical examination without abnormal findings: Secondary | ICD-10-CM

## 2024-05-27 DIAGNOSIS — E2839 Other primary ovarian failure: Secondary | ICD-10-CM

## 2024-06-08 ENCOUNTER — Ambulatory Visit
Admission: RE | Admit: 2024-06-08 | Discharge: 2024-06-08 | Disposition: A | Discharge status: Home / Self Care | Source: Ambulatory Visit | Attending: Internal Medicine | Admitting: Internal Medicine

## 2024-06-08 DIAGNOSIS — Z0000 Encounter for general adult medical examination without abnormal findings: Secondary | ICD-10-CM

## 2024-06-11 ENCOUNTER — Other Ambulatory Visit: Payer: Self-pay | Admitting: Internal Medicine

## 2024-06-11 DIAGNOSIS — R928 Other abnormal and inconclusive findings on diagnostic imaging of breast: Secondary | ICD-10-CM

## 2024-06-18 ENCOUNTER — Ambulatory Visit
Admission: RE | Admit: 2024-06-18 | Discharge: 2024-06-18 | Disposition: A | Discharge status: Home / Self Care | Source: Ambulatory Visit | Attending: Internal Medicine | Admitting: Internal Medicine

## 2024-06-18 DIAGNOSIS — R928 Other abnormal and inconclusive findings on diagnostic imaging of breast: Secondary | ICD-10-CM

## 2024-06-19 ENCOUNTER — Other Ambulatory Visit: Payer: Self-pay | Admitting: Internal Medicine

## 2024-06-19 DIAGNOSIS — R921 Mammographic calcification found on diagnostic imaging of breast: Secondary | ICD-10-CM

## 2024-12-30 ENCOUNTER — Other Ambulatory Visit (HOSPITAL_BASED_OUTPATIENT_CLINIC_OR_DEPARTMENT_OTHER)
